# Patient Record
Sex: Female | Born: 1950
Health system: Southern US, Community
[De-identification: ages and names within clinical notes are randomized; demographics above are authoritative.]

## PROBLEM LIST (undated history)

## (undated) DIAGNOSIS — N838 Other noninflammatory disorders of ovary, fallopian tube and broad ligament: Secondary | ICD-10-CM

## (undated) DIAGNOSIS — E785 Hyperlipidemia, unspecified: Secondary | ICD-10-CM

## (undated) DIAGNOSIS — M549 Dorsalgia, unspecified: Secondary | ICD-10-CM

## (undated) DIAGNOSIS — F32A Depression, unspecified: Secondary | ICD-10-CM

## (undated) DIAGNOSIS — R4020 Unspecified coma: Secondary | ICD-10-CM

## (undated) DIAGNOSIS — M797 Fibromyalgia: Secondary | ICD-10-CM

## (undated) DIAGNOSIS — J209 Acute bronchitis, unspecified: Secondary | ICD-10-CM

## (undated) DIAGNOSIS — F329 Major depressive disorder, single episode, unspecified: Secondary | ICD-10-CM

## (undated) DIAGNOSIS — R7303 Prediabetes: Secondary | ICD-10-CM

## (undated) DIAGNOSIS — I10 Essential (primary) hypertension: Secondary | ICD-10-CM

## (undated) DIAGNOSIS — IMO0001 Reserved for inherently not codable concepts without codable children: Secondary | ICD-10-CM

## (undated) DIAGNOSIS — N3281 Overactive bladder: Secondary | ICD-10-CM

## (undated) DIAGNOSIS — J449 Chronic obstructive pulmonary disease, unspecified: Secondary | ICD-10-CM

## (undated) DIAGNOSIS — J9601 Acute respiratory failure with hypoxia: Secondary | ICD-10-CM

## (undated) DIAGNOSIS — J45909 Unspecified asthma, uncomplicated: Secondary | ICD-10-CM

## (undated) DIAGNOSIS — D751 Secondary polycythemia: Secondary | ICD-10-CM

## (undated) DIAGNOSIS — K219 Gastro-esophageal reflux disease without esophagitis: Secondary | ICD-10-CM

## (undated) HISTORY — DX: Dorsalgia, unspecified: M54.9

## (undated) HISTORY — DX: Depression, unspecified: F32.A

## (undated) HISTORY — DX: Major depressive disorder, single episode, unspecified: F32.9

## (undated) HISTORY — DX: Essential (primary) hypertension: I10

## (undated) HISTORY — PX: ABDOMINAL SURGERY: SHX537

## (undated) HISTORY — DX: Hyperlipidemia, unspecified: E78.5

## (undated) HISTORY — PX: COSMETIC SURGERY: SHX468

---

## 2007-05-20 ENCOUNTER — Emergency Department (HOSPITAL_COMMUNITY): Admission: EM | Admit: 2007-05-20 | Discharge: 2007-05-20 | Payer: Self-pay | Admitting: Emergency Medicine

## 2008-11-14 ENCOUNTER — Emergency Department (HOSPITAL_COMMUNITY): Admission: EM | Admit: 2008-11-14 | Discharge: 2008-11-14 | Payer: Self-pay | Admitting: Emergency Medicine

## 2008-11-21 ENCOUNTER — Emergency Department (HOSPITAL_COMMUNITY): Admission: EM | Admit: 2008-11-21 | Discharge: 2008-11-21 | Payer: Self-pay | Admitting: Emergency Medicine

## 2009-08-20 ENCOUNTER — Emergency Department (HOSPITAL_COMMUNITY): Admission: EM | Admit: 2009-08-20 | Discharge: 2009-08-20 | Payer: Self-pay | Admitting: Emergency Medicine

## 2009-11-24 ENCOUNTER — Ambulatory Visit: Payer: Self-pay | Admitting: Internal Medicine

## 2009-12-21 ENCOUNTER — Emergency Department (HOSPITAL_COMMUNITY): Admission: EM | Admit: 2009-12-21 | Discharge: 2009-12-21 | Payer: Self-pay | Admitting: Emergency Medicine

## 2010-01-12 ENCOUNTER — Ambulatory Visit: Payer: Self-pay | Admitting: Internal Medicine

## 2010-01-28 ENCOUNTER — Encounter (INDEPENDENT_AMBULATORY_CARE_PROVIDER_SITE_OTHER): Payer: Self-pay | Admitting: Family Medicine

## 2010-01-28 ENCOUNTER — Ambulatory Visit: Payer: Self-pay | Admitting: Internal Medicine

## 2010-01-28 LAB — CONVERTED CEMR LAB
Albumin: 4.6 g/dL (ref 3.5–5.2)
BUN: 15 mg/dL (ref 6–23)
Chloride: 102 meq/L (ref 96–112)
Creatinine, Ser: 0.63 mg/dL (ref 0.40–1.20)
HDL: 68 mg/dL (ref >39)
Hemoglobin: 12.9 g/dL (ref 12.0–15.0)
LDL Cholesterol: 193 mg/dL — ABNORMAL HIGH (ref 0–99)
Lymphs Abs: 2.8 10*3/uL (ref 0.7–4.0)
MCHC: 32.7 g/dL (ref 30.0–36.0)
MCV: 90.6 fL (ref 78.0–100.0)
Potassium: 4.4 meq/L (ref 3.5–5.3)
RBC: 4.36 M/uL (ref 3.87–5.11)
RDW: 13.4 % (ref 11.5–15.5)
TSH: 0.656 microintl units/mL (ref 0.350–4.500)
Total CHOL/HDL Ratio: 4.1
Total Protein: 7.1 g/dL (ref 6.0–8.3)
Triglycerides: 87 mg/dL (ref <150)
Vit D, 25-Hydroxy: 21 ng/mL — ABNORMAL LOW (ref 30–89)
WBC: 7.3 10*3/uL (ref 4.0–10.5)

## 2010-02-18 ENCOUNTER — Encounter: Admission: RE | Admit: 2010-02-18 | Discharge: 2010-03-18 | Payer: Self-pay | Admitting: Family Medicine

## 2011-01-19 LAB — GLUCOSE, CAPILLARY: Glucose-Capillary: 83 mg/dL (ref 70–99)

## 2011-02-03 LAB — URINALYSIS, ROUTINE W REFLEX MICROSCOPIC
Ketones, ur: NEGATIVE mg/dL
Nitrite: NEGATIVE
Protein, ur: NEGATIVE mg/dL
Specific Gravity, Urine: 1.014 (ref 1.005–1.030)
pH: 6 (ref 5.0–8.0)

## 2011-02-03 LAB — URINE MICROSCOPIC-ADD ON

## 2011-02-14 LAB — DIFFERENTIAL
Basophils Absolute: 0 10*3/uL (ref 0.0–0.1)
Basophils Relative: 1 % (ref 0–1)
Eosinophils Relative: 1 % (ref 0–5)
Lymphocytes Relative: 24 % (ref 12–46)
Lymphs Abs: 2 10*3/uL (ref 0.7–4.0)
Monocytes Absolute: 0.4 10*3/uL (ref 0.1–1.0)
Neutro Abs: 5.6 10*3/uL (ref 1.7–7.7)
Neutrophils Relative %: 69 % (ref 43–77)

## 2011-02-14 LAB — CBC
HCT: 41.5 % (ref 36.0–46.0)
RDW: 13.5 % (ref 11.5–15.5)
WBC: 8.1 10*3/uL (ref 4.0–10.5)

## 2011-02-14 LAB — BASIC METABOLIC PANEL
BUN: 12 mg/dL (ref 6–23)
Chloride: 105 mEq/L (ref 96–112)
Creatinine, Ser: 0.78 mg/dL (ref 0.4–1.2)
GFR calc Af Amer: >60 mL/min (ref >60)
Glucose, Bld: 183 mg/dL — ABNORMAL HIGH (ref 70–99)
Sodium: 139 mEq/L (ref 135–145)

## 2011-02-14 LAB — URINALYSIS, ROUTINE W REFLEX MICROSCOPIC
Hgb urine dipstick: NEGATIVE
Urobilinogen, UA: 0.2 mg/dL (ref 0.0–1.0)
pH: 5.5 (ref 5.0–8.0)

## 2011-08-15 LAB — DIFFERENTIAL
Basophils Relative: 0
Eosinophils Absolute: 0
Eosinophils Relative: 0
Monocytes Absolute: 0.3

## 2011-08-15 LAB — COMPREHENSIVE METABOLIC PANEL
AST: 26
Albumin: 4.2
Alkaline Phosphatase: 71
Calcium: 9.2
Chloride: 106
GFR calc non Af Amer: >60
Total Bilirubin: 1

## 2011-08-15 LAB — URINALYSIS, ROUTINE W REFLEX MICROSCOPIC
Glucose, UA: NEGATIVE
Ketones, ur: 15 — AB
Leukocytes, UA: NEGATIVE
Protein, ur: 30 — AB
Specific Gravity, Urine: >1.046 — ABNORMAL HIGH
pH: 5.5

## 2011-08-15 LAB — URINE MICROSCOPIC-ADD ON

## 2011-08-15 LAB — RAPID URINE DRUG SCREEN, HOSP PERFORMED
Benzodiazepines: NOT DETECTED
Cocaine: NOT DETECTED
Tetrahydrocannabinol: POSITIVE — AB

## 2011-08-15 LAB — CBC
Hemoglobin: 14.8
MCV: 92
RBC: 4.67
RDW: 13.9

## 2011-08-15 LAB — SALICYLATE LEVEL: Salicylate Lvl: <4

## 2013-04-10 ENCOUNTER — Other Ambulatory Visit (HOSPITAL_COMMUNITY): Payer: Self-pay | Admitting: Family Medicine

## 2013-04-10 DIAGNOSIS — R109 Unspecified abdominal pain: Secondary | ICD-10-CM

## 2013-04-10 DIAGNOSIS — Z1231 Encounter for screening mammogram for malignant neoplasm of breast: Secondary | ICD-10-CM

## 2013-04-16 ENCOUNTER — Ambulatory Visit (HOSPITAL_COMMUNITY)
Admission: RE | Admit: 2013-04-16 | Discharge: 2013-04-16 | Disposition: A | Discharge status: Home / Self Care | Payer: Medicaid Other | Source: Ambulatory Visit | Attending: Family Medicine | Admitting: Family Medicine

## 2013-04-16 DIAGNOSIS — R109 Unspecified abdominal pain: Secondary | ICD-10-CM | POA: Insufficient documentation

## 2013-04-16 DIAGNOSIS — K7689 Other specified diseases of liver: Secondary | ICD-10-CM | POA: Insufficient documentation

## 2013-04-17 ENCOUNTER — Ambulatory Visit (HOSPITAL_COMMUNITY)
Admission: RE | Admit: 2013-04-17 | Discharge: 2013-04-17 | Disposition: A | Discharge status: Home / Self Care | Payer: Medicaid Other | Source: Ambulatory Visit | Attending: Family Medicine | Admitting: Family Medicine

## 2013-04-17 DIAGNOSIS — Z1231 Encounter for screening mammogram for malignant neoplasm of breast: Secondary | ICD-10-CM | POA: Insufficient documentation

## 2013-08-22 ENCOUNTER — Ambulatory Visit (HOSPITAL_COMMUNITY)
Admission: RE | Admit: 2013-08-22 | Discharge: 2013-08-22 | Disposition: A | Discharge status: Home / Self Care | Payer: Medicaid Other | Source: Ambulatory Visit | Attending: Nurse Practitioner | Admitting: Nurse Practitioner

## 2013-08-22 ENCOUNTER — Other Ambulatory Visit (HOSPITAL_COMMUNITY): Payer: Self-pay | Admitting: Nurse Practitioner

## 2013-08-22 DIAGNOSIS — M545 Low back pain, unspecified: Secondary | ICD-10-CM | POA: Insufficient documentation

## 2013-08-22 DIAGNOSIS — Z9181 History of falling: Secondary | ICD-10-CM | POA: Insufficient documentation

## 2013-08-22 DIAGNOSIS — M549 Dorsalgia, unspecified: Secondary | ICD-10-CM

## 2014-05-15 ENCOUNTER — Encounter: Payer: Self-pay | Admitting: Neurology

## 2014-05-15 ENCOUNTER — Encounter: Payer: Self-pay | Admitting: *Deleted

## 2014-05-19 ENCOUNTER — Ambulatory Visit: Payer: Medicaid Other | Admitting: Neurology

## 2014-05-19 ENCOUNTER — Telehealth: Payer: Self-pay | Admitting: Neurology

## 2014-05-19 NOTE — Telephone Encounter (Signed)
This patient canceled within an hour or 2  before her appointment today for a new patient appointment.

## 2014-09-17 ENCOUNTER — Encounter: Payer: Self-pay | Admitting: Neurology

## 2014-09-23 ENCOUNTER — Encounter: Payer: Self-pay | Admitting: Neurology

## 2015-04-13 ENCOUNTER — Emergency Department (HOSPITAL_COMMUNITY): Payer: Medicaid Other

## 2015-04-13 ENCOUNTER — Inpatient Hospital Stay (HOSPITAL_COMMUNITY)
Admission: EM | Admit: 2015-04-13 | Discharge: 2015-04-15 | DRG: 190 | Disposition: A | Discharge status: Home / Self Care | Payer: Medicaid Other | Attending: Internal Medicine | Admitting: Internal Medicine

## 2015-04-13 ENCOUNTER — Encounter (HOSPITAL_COMMUNITY): Payer: Self-pay | Admitting: Emergency Medicine

## 2015-04-13 DIAGNOSIS — J209 Acute bronchitis, unspecified: Secondary | ICD-10-CM | POA: Diagnosis present

## 2015-04-13 DIAGNOSIS — E860 Dehydration: Secondary | ICD-10-CM | POA: Diagnosis present

## 2015-04-13 DIAGNOSIS — F17210 Nicotine dependence, cigarettes, uncomplicated: Secondary | ICD-10-CM | POA: Diagnosis present

## 2015-04-13 DIAGNOSIS — Z6821 Body mass index (BMI) 21.0-21.9, adult: Secondary | ICD-10-CM | POA: Diagnosis not present

## 2015-04-13 DIAGNOSIS — D751 Secondary polycythemia: Secondary | ICD-10-CM

## 2015-04-13 DIAGNOSIS — Z7952 Long term (current) use of systemic steroids: Secondary | ICD-10-CM

## 2015-04-13 DIAGNOSIS — J9601 Acute respiratory failure with hypoxia: Secondary | ICD-10-CM | POA: Diagnosis present

## 2015-04-13 DIAGNOSIS — Z79899 Other long term (current) drug therapy: Secondary | ICD-10-CM | POA: Diagnosis not present

## 2015-04-13 DIAGNOSIS — J440 Chronic obstructive pulmonary disease with acute lower respiratory infection: Secondary | ICD-10-CM | POA: Diagnosis present

## 2015-04-13 DIAGNOSIS — E119 Type 2 diabetes mellitus without complications: Secondary | ICD-10-CM | POA: Diagnosis present

## 2015-04-13 DIAGNOSIS — Z7951 Long term (current) use of inhaled steroids: Secondary | ICD-10-CM | POA: Diagnosis not present

## 2015-04-13 DIAGNOSIS — R4020 Unspecified coma: Secondary | ICD-10-CM | POA: Diagnosis present

## 2015-04-13 DIAGNOSIS — J45909 Unspecified asthma, uncomplicated: Secondary | ICD-10-CM | POA: Diagnosis present

## 2015-04-13 DIAGNOSIS — J441 Chronic obstructive pulmonary disease with (acute) exacerbation: Secondary | ICD-10-CM | POA: Diagnosis present

## 2015-04-13 DIAGNOSIS — F1110 Opioid abuse, uncomplicated: Secondary | ICD-10-CM | POA: Diagnosis present

## 2015-04-13 DIAGNOSIS — M549 Dorsalgia, unspecified: Secondary | ICD-10-CM | POA: Diagnosis present

## 2015-04-13 DIAGNOSIS — E785 Hyperlipidemia, unspecified: Secondary | ICD-10-CM | POA: Diagnosis not present

## 2015-04-13 DIAGNOSIS — M797 Fibromyalgia: Secondary | ICD-10-CM | POA: Diagnosis present

## 2015-04-13 DIAGNOSIS — F329 Major depressive disorder, single episode, unspecified: Secondary | ICD-10-CM | POA: Diagnosis present

## 2015-04-13 DIAGNOSIS — R0602 Shortness of breath: Secondary | ICD-10-CM

## 2015-04-13 DIAGNOSIS — E440 Moderate protein-calorie malnutrition: Secondary | ICD-10-CM | POA: Diagnosis present

## 2015-04-13 DIAGNOSIS — R739 Hyperglycemia, unspecified: Secondary | ICD-10-CM | POA: Diagnosis present

## 2015-04-13 DIAGNOSIS — F32A Depression, unspecified: Secondary | ICD-10-CM | POA: Diagnosis present

## 2015-04-13 DIAGNOSIS — R0902 Hypoxemia: Secondary | ICD-10-CM

## 2015-04-13 DIAGNOSIS — F418 Other specified anxiety disorders: Secondary | ICD-10-CM | POA: Diagnosis present

## 2015-04-13 DIAGNOSIS — R55 Syncope and collapse: Secondary | ICD-10-CM | POA: Diagnosis present

## 2015-04-13 DIAGNOSIS — I10 Essential (primary) hypertension: Secondary | ICD-10-CM | POA: Diagnosis present

## 2015-04-13 DIAGNOSIS — R0600 Dyspnea, unspecified: Secondary | ICD-10-CM | POA: Diagnosis not present

## 2015-04-13 HISTORY — DX: Prediabetes: R73.03

## 2015-04-13 HISTORY — DX: Secondary polycythemia: D75.1

## 2015-04-13 HISTORY — DX: Chronic obstructive pulmonary disease, unspecified: J44.9

## 2015-04-13 HISTORY — DX: Reserved for inherently not codable concepts without codable children: IMO0001

## 2015-04-13 HISTORY — DX: Unspecified asthma, uncomplicated: J45.909

## 2015-04-13 HISTORY — DX: Acute respiratory failure with hypoxia: J96.01

## 2015-04-13 HISTORY — DX: Acute bronchitis, unspecified: J20.9

## 2015-04-13 LAB — URINE MICROSCOPIC-ADD ON

## 2015-04-13 LAB — CBC WITH DIFFERENTIAL/PLATELET
Basophils Absolute: 0.2 10*3/uL — ABNORMAL HIGH (ref 0.0–0.1)
Basophils Relative: 3 % — ABNORMAL HIGH (ref 0–1)
EOS PCT: 6 % — AB (ref 0–5)
Eosinophils Absolute: 0.5 10*3/uL (ref 0.0–0.7)
HCT: 45.3 % (ref 36.0–46.0)
Hemoglobin: 15 g/dL (ref 12.0–15.0)
Lymphocytes Relative: 28 % (ref 12–46)
Lymphs Abs: 2.2 10*3/uL (ref 0.7–4.0)
MCH: 30.2 pg (ref 26.0–34.0)
MCHC: 33.1 g/dL (ref 30.0–36.0)
MCV: 91.1 fL (ref 78.0–100.0)
MONOS PCT: 6 % (ref 3–12)
Monocytes Absolute: 0.5 10*3/uL (ref 0.1–1.0)
NEUTROS ABS: 4.3 10*3/uL (ref 1.7–7.7)
Neutrophils Relative %: 57 % (ref 43–77)
Platelets: 352 10*3/uL (ref 150–400)
RBC: 4.97 MIL/uL (ref 3.87–5.11)
RDW: 13.7 % (ref 11.5–15.5)
WBC: 7.7 10*3/uL (ref 4.0–10.5)

## 2015-04-13 LAB — I-STAT ARTERIAL BLOOD GAS, ED
ACID-BASE DEFICIT: 1 mmol/L (ref 0.0–2.0)
BICARBONATE: 25 meq/L — AB (ref 20.0–24.0)
O2 Saturation: 91 %
PH ART: 7.358 (ref 7.350–7.450)
TCO2: 26 mmol/L (ref 0–100)
pCO2 arterial: 44.5 mmHg (ref 35.0–45.0)
pO2, Arterial: 64 mmHg — ABNORMAL LOW (ref 80.0–100.0)

## 2015-04-13 LAB — I-STAT CHEM 8, ED
BUN: 10 mg/dL (ref 6–20)
CHLORIDE: 102 mmol/L (ref 101–111)
Calcium, Ion: 1.11 mmol/L — ABNORMAL LOW (ref 1.13–1.30)
Creatinine, Ser: 0.7 mg/dL (ref 0.44–1.00)
GLUCOSE: 129 mg/dL — AB (ref 65–99)
HCT: 48 % — ABNORMAL HIGH (ref 36.0–46.0)
Hemoglobin: 16.3 g/dL — ABNORMAL HIGH (ref 12.0–15.0)
Potassium: 3.8 mmol/L (ref 3.5–5.1)
Sodium: 140 mmol/L (ref 135–145)
TCO2: 24 mmol/L (ref 0–100)

## 2015-04-13 LAB — PROTIME-INR
INR: 0.94 (ref 0.00–1.49)
PROTHROMBIN TIME: 12.8 s (ref 11.6–15.2)

## 2015-04-13 LAB — T4, FREE: Free T4: 1.26 ng/dL — ABNORMAL HIGH (ref 0.61–1.12)

## 2015-04-13 LAB — RAPID URINE DRUG SCREEN, HOSP PERFORMED
Amphetamines: NOT DETECTED
Barbiturates: NOT DETECTED
Benzodiazepines: NOT DETECTED
Cocaine: NOT DETECTED
OPIATES: POSITIVE — AB
TETRAHYDROCANNABINOL: NOT DETECTED

## 2015-04-13 LAB — URINALYSIS, ROUTINE W REFLEX MICROSCOPIC
Bilirubin Urine: NEGATIVE
HGB URINE DIPSTICK: NEGATIVE
Ketones, ur: 15 mg/dL — AB
Leukocytes, UA: NEGATIVE
Nitrite: NEGATIVE
PROTEIN: NEGATIVE mg/dL
Specific Gravity, Urine: 1.017 (ref 1.005–1.030)
Urobilinogen, UA: 0.2 mg/dL (ref 0.0–1.0)
pH: 5 (ref 5.0–8.0)

## 2015-04-13 LAB — I-STAT TROPONIN, ED: TROPONIN I, POC: 0 ng/mL (ref 0.00–0.08)

## 2015-04-13 LAB — D-DIMER, QUANTITATIVE (NOT AT ARMC): D-Dimer, Quant: 0.45 ug/mL-FEU (ref 0.00–0.48)

## 2015-04-13 LAB — TSH: TSH: 0.231 u[IU]/mL — AB (ref 0.350–4.500)

## 2015-04-13 LAB — BRAIN NATRIURETIC PEPTIDE: B Natriuretic Peptide: 43.9 pg/mL (ref 0.0–100.0)

## 2015-04-13 LAB — ETHANOL

## 2015-04-13 MED ORDER — IPRATROPIUM BROMIDE 0.02 % IN SOLN
1.0000 mg | Freq: Once | RESPIRATORY_TRACT | Status: AC
  Administered 2015-04-13: 1 mg via RESPIRATORY_TRACT
  Filled 2015-04-13: qty 5

## 2015-04-13 MED ORDER — METHYLPREDNISOLONE SODIUM SUCC 125 MG IJ SOLR
125.0000 mg | Freq: Once | INTRAMUSCULAR | Status: AC
  Administered 2015-04-13: 125 mg via INTRAVENOUS
  Filled 2015-04-13: qty 2

## 2015-04-13 MED ORDER — AMLODIPINE BESYLATE 5 MG PO TABS
5.0000 mg | ORAL_TABLET | Freq: Every day | ORAL | Status: DC
  Administered 2015-04-13 – 2015-04-15 (×3): 5 mg via ORAL
  Filled 2015-04-13 (×3): qty 1

## 2015-04-13 MED ORDER — HYDROCHLOROTHIAZIDE 25 MG PO TABS
25.0000 mg | ORAL_TABLET | Freq: Every day | ORAL | Status: DC
  Administered 2015-04-13 – 2015-04-14 (×2): 25 mg via ORAL
  Filled 2015-04-13 (×2): qty 1

## 2015-04-13 MED ORDER — MAGNESIUM SULFATE 2 GM/50ML IV SOLN
2.0000 g | Freq: Once | INTRAVENOUS | Status: AC
  Administered 2015-04-13: 2 g via INTRAVENOUS
  Filled 2015-04-13: qty 50

## 2015-04-13 MED ORDER — IPRATROPIUM-ALBUTEROL 0.5-2.5 (3) MG/3ML IN SOLN
3.0000 mL | RESPIRATORY_TRACT | Status: DC
  Administered 2015-04-13 – 2015-04-14 (×9): 3 mL via RESPIRATORY_TRACT
  Filled 2015-04-13 (×10): qty 3

## 2015-04-13 MED ORDER — GUAIFENESIN 100 MG/5ML PO SYRP
200.0000 mg | ORAL_SOLUTION | ORAL | Status: DC | PRN
  Administered 2015-04-13: 200 mg via ORAL
  Filled 2015-04-13 (×2): qty 10

## 2015-04-13 MED ORDER — ACETAMINOPHEN 650 MG RE SUPP: 650.0000 mg | Freq: Four times a day (QID) | RECTAL | Status: DC | PRN

## 2015-04-13 MED ORDER — DOCUSATE SODIUM 100 MG PO CAPS
100.0000 mg | ORAL_CAPSULE | Freq: Two times a day (BID) | ORAL | Status: DC
  Administered 2015-04-13 – 2015-04-15 (×5): 100 mg via ORAL
  Filled 2015-04-13 (×6): qty 1

## 2015-04-13 MED ORDER — THIAMINE HCL 100 MG/ML IJ SOLN
100.0000 mg | Freq: Every day | INTRAMUSCULAR | Status: DC
  Administered 2015-04-13: 100 mg via INTRAVENOUS
  Filled 2015-04-13 (×2): qty 1

## 2015-04-13 MED ORDER — SODIUM CHLORIDE 0.9 % IJ SOLN
3.0000 mL | Freq: Two times a day (BID) | INTRAMUSCULAR | Status: DC
  Administered 2015-04-13 – 2015-04-14 (×4): 3 mL via INTRAVENOUS

## 2015-04-13 MED ORDER — TRAZODONE HCL 50 MG PO TABS
50.0000 mg | ORAL_TABLET | Freq: Every day | ORAL | Status: DC
  Administered 2015-04-13 – 2015-04-14 (×2): 50 mg via ORAL
  Filled 2015-04-13 (×3): qty 1

## 2015-04-13 MED ORDER — IPRATROPIUM-ALBUTEROL 0.5-2.5 (3) MG/3ML IN SOLN: 3.0000 mL | RESPIRATORY_TRACT | Status: DC | PRN

## 2015-04-13 MED ORDER — DEXTROSE 5 % IV SOLN
500.0000 mg | INTRAVENOUS | Status: DC
  Administered 2015-04-14: 500 mg via INTRAVENOUS
  Filled 2015-04-13 (×2): qty 500

## 2015-04-13 MED ORDER — CYCLOBENZAPRINE HCL 5 MG PO TABS
5.0000 mg | ORAL_TABLET | Freq: Two times a day (BID) | ORAL | Status: DC
  Administered 2015-04-13 – 2015-04-15 (×5): 5 mg via ORAL
  Filled 2015-04-13 (×7): qty 1

## 2015-04-13 MED ORDER — SODIUM CHLORIDE 0.9 % IV BOLUS (SEPSIS)
1000.0000 mL | Freq: Once | INTRAVENOUS | Status: AC
Start: 2015-04-13 — End: 2015-04-13
  Administered 2015-04-13: 1000 mL via INTRAVENOUS

## 2015-04-13 MED ORDER — TIOTROPIUM BROMIDE MONOHYDRATE 18 MCG IN CAPS: 18.0000 ug | ORAL_CAPSULE | Freq: Every day | RESPIRATORY_TRACT | Status: DC

## 2015-04-13 MED ORDER — GABAPENTIN 100 MG PO CAPS
100.0000 mg | ORAL_CAPSULE | Freq: Two times a day (BID) | ORAL | Status: DC
  Administered 2015-04-13 – 2015-04-15 (×5): 100 mg via ORAL
  Filled 2015-04-13 (×7): qty 1

## 2015-04-13 MED ORDER — ALBUTEROL (5 MG/ML) CONTINUOUS INHALATION SOLN
10.0000 mg/h | INHALATION_SOLUTION | RESPIRATORY_TRACT | Status: DC
  Administered 2015-04-13: 10 mg/h via RESPIRATORY_TRACT
  Filled 2015-04-13 (×2): qty 20

## 2015-04-13 MED ORDER — ONDANSETRON HCL 4 MG PO TABS: 4.0000 mg | ORAL_TABLET | Freq: Four times a day (QID) | ORAL | Status: DC | PRN

## 2015-04-13 MED ORDER — FOLIC ACID 5 MG/ML IJ SOLN
1.0000 mg | Freq: Every day | INTRAMUSCULAR | Status: DC
  Administered 2015-04-13: 1 mg via INTRAVENOUS
  Filled 2015-04-13 (×2): qty 0.2

## 2015-04-13 MED ORDER — CITALOPRAM HYDROBROMIDE 10 MG PO TABS
10.0000 mg | ORAL_TABLET | Freq: Every day | ORAL | Status: DC
  Administered 2015-04-13 – 2015-04-15 (×3): 10 mg via ORAL
  Filled 2015-04-13 (×3): qty 1

## 2015-04-13 MED ORDER — PRAVASTATIN SODIUM 40 MG PO TABS
40.0000 mg | ORAL_TABLET | Freq: Every day | ORAL | Status: DC
  Administered 2015-04-13 – 2015-04-15 (×3): 40 mg via ORAL
  Filled 2015-04-13 (×3): qty 1

## 2015-04-13 MED ORDER — ACETAMINOPHEN 325 MG PO TABS: 650.0000 mg | ORAL_TABLET | Freq: Four times a day (QID) | ORAL | Status: DC | PRN

## 2015-04-13 MED ORDER — DEXTROSE 5 % IV SOLN
500.0000 mg | Freq: Once | INTRAVENOUS | Status: AC
  Administered 2015-04-13: 500 mg via INTRAVENOUS
  Filled 2015-04-13: qty 500

## 2015-04-13 MED ORDER — METHYLPREDNISOLONE SODIUM SUCC 125 MG IJ SOLR
60.0000 mg | Freq: Four times a day (QID) | INTRAMUSCULAR | Status: DC
  Administered 2015-04-13 – 2015-04-14 (×5): 60 mg via INTRAVENOUS
  Filled 2015-04-13: qty 2
  Filled 2015-04-13 (×2): qty 0.96
  Filled 2015-04-13: qty 2
  Filled 2015-04-13: qty 0.96
  Filled 2015-04-13: qty 2
  Filled 2015-04-13: qty 0.96
  Filled 2015-04-13: qty 2

## 2015-04-13 MED ORDER — ENOXAPARIN SODIUM 40 MG/0.4ML ~~LOC~~ SOLN
40.0000 mg | SUBCUTANEOUS | Status: DC
  Administered 2015-04-13 – 2015-04-14 (×2): 40 mg via SUBCUTANEOUS
  Filled 2015-04-13 (×3): qty 0.4

## 2015-04-13 MED ORDER — ALUM & MAG HYDROXIDE-SIMETH 200-200-20 MG/5ML PO SUSP: 30.0000 mL | Freq: Four times a day (QID) | ORAL | Status: DC | PRN

## 2015-04-13 MED ORDER — ENSURE ENLIVE PO LIQD
237.0000 mL | Freq: Two times a day (BID) | ORAL | Status: DC
  Administered 2015-04-13 – 2015-04-14 (×3): 237 mL via ORAL

## 2015-04-13 MED ORDER — GABAPENTIN 300 MG PO CAPS
300.0000 mg | ORAL_CAPSULE | Freq: Every day | ORAL | Status: DC
  Administered 2015-04-13 – 2015-04-14 (×2): 300 mg via ORAL
  Filled 2015-04-13 (×3): qty 1

## 2015-04-13 MED ORDER — ONDANSETRON HCL 4 MG/2ML IJ SOLN: 4.0000 mg | Freq: Four times a day (QID) | INTRAMUSCULAR | Status: DC | PRN

## 2015-04-13 NOTE — ED Notes (Signed)
Report given to [place], RN on [place] [phone]).

## 2015-04-13 NOTE — ED Notes (Signed)
Pt states that she has been coughing ever since she moved into new apt.

## 2015-04-13 NOTE — Progress Notes (Signed)
TSH low at 0.231 so ck Free T4 and T3; d dimer normal, UA with >1000 glucose and ketones 15- HgbA1c already pending, UDS only positive for opiates which she received in ER.  Erin Hearing, ANP

## 2015-04-13 NOTE — ED Notes (Signed)
Patient here via bystander. Patient noted to have collapsed on roadside. Passer by brought patient in for evaluation. States that for 2 days she has been feeling very short of breath and has been having increased chest discomfort. States that her medicine is not working. States she uses inhalers.

## 2015-04-13 NOTE — ED Notes (Signed)
Attempted to call report. Nurses unable to take report.

## 2015-04-13 NOTE — ED Provider Notes (Signed)
CSN: [id number]     Arrival date & time 04/13/15  6629 History   First MD Initiated Contact with Patient 04/13/15 0530     Chief Complaint  Patient presents with  . Shortness of Breath  . Chest Pain     (Consider location/radiation/quality/duration/timing/severity/associated sxs/prior Treatment) HPI Tracey Ferrell is a 64yo female, PMH of COPD presenting today with acute SOB.  History obtained from bystander who brought the patient in.  He states he [place] of [employer] around 430[place] and saw the patient breathing heavily on the floor and appears distressed.  He drove her to the ED for evaluation.  She states this feels like her COPD.  She does not wear oxygen at home.  She has been having a worsening cough.  No further history can be obtained due to resp distress.     Past Medical History  Diagnosis Date  . Back pain   . Depression   . Hyperlipidemia   . Hypertension    History reviewed. No pertinent past surgical history. History reviewed. No pertinent family history. History  Substance Use Topics  . Smoking status: Current Some Day Smoker -- 0.50 packs/day for 44 years    Types: Cigarettes  . Smokeless tobacco: Never Used  . Alcohol Use: No     Comment: former   OB History    No data available     Review of Systems  Unable to perform ROS: Severe respiratory distress      Allergies  Review of patient's allergies indicates no known allergies.  Home Medications   Prior to Admission medications   Medication Sig Start Date End Date Taking? Authorizing Provider  citalopram (CELEXA) 10 MG tablet Take 10 mg by mouth daily.    Historical Provider, MD  cyclobenzaprine (FLEXERIL) 5 MG tablet Take 5 mg by mouth 2 (two) times daily.    Historical Provider, MD  gabapentin (NEURONTIN) 100 MG capsule Take 100 mg by mouth 2 (two) times daily.    Historical Provider, MD  gabapentin (NEURONTIN) 300 MG capsule Take 300 mg by mouth at bedtime.    Historical Provider, MD   hydrochlorothiazide (HYDRODIURIL) 25 MG tablet Take 25 mg by mouth daily.    Historical Provider, MD  pravastatin (PRAVACHOL) 40 MG tablet Take 40 mg by mouth daily.    Historical Provider, MD  traZODone (DESYREL) 50 MG tablet Take 50 mg by mouth at bedtime.    Historical Provider, MD   BP 180/84 mmHg  Pulse 87  Temp(Src) 97.7 F (36.5 C) (Oral)  Resp 19  SpO2 100% Physical Exam  Constitutional: She appears well-developed and well-nourished. She appears distressed.  HENT:  Head: Normocephalic and atraumatic.  Nose: Nose normal.  Mouth/Throat: Oropharynx is clear and moist. No oropharyngeal exudate.  Eyes: Conjunctivae and EOM are normal. Pupils are equal, round, and reactive to light. No scleral icterus.  Neck: Normal range of motion. Neck supple. No JVD present. No tracheal deviation present. No thyromegaly present.  Cardiovascular: Normal rate, regular rhythm and normal heart sounds.  Exam reveals no gallop and no friction rub.   No murmur heard. Pulmonary/Chest: She is in respiratory distress. She has wheezes. She exhibits no tenderness.  Dec BS bilaterally, faint exp wheezes heard with prolonged phase, +use of accessory muscles  Abdominal: Soft. Bowel sounds are normal. She exhibits no distension and no mass. There is no tenderness. There is no rebound and no guarding.  Musculoskeletal: Normal range of motion. She exhibits no edema or tenderness.  Lymphadenopathy:    She has no cervical adenopathy.  Neurological: She is alert.  Skin: Skin is warm and dry. No rash noted. She is not diaphoretic. No erythema. No pallor.  Nursing note and vitals reviewed.   ED Course  Procedures (including critical care time) Labs Review Labs Reviewed  I-STAT CHEM 8, ED - Abnormal; Notable for the following:    Glucose, Bld 129 (*)    Calcium, Ion 1.11 (*)    Hemoglobin 16.3 (*)    HCT 48.0 (*)    All other components within normal limits  I-STAT ARTERIAL BLOOD GAS, ED - Abnormal; Notable  for the following:    pO2, Arterial 64.0 (*)    Bicarbonate 25.0 (*)    All other components within normal limits  CBC WITH DIFFERENTIAL/PLATELET  PROTIME-INR  BRAIN NATRIURETIC PEPTIDE  BLOOD GAS, ARTERIAL  I-STAT TROPOININ, ED    Imaging Review Dg Chest Port 1 View  04/13/2015   CLINICAL DATA:  Shortness of breath.  History of hypertension.  EXAM: PORTABLE CHEST - 1 VIEW  COMPARISON:  Chest radiograph November 21, 2008  FINDINGS: Cardiomediastinal silhouette is unremarkable. The lungs are clear without pleural effusions or focal consolidations. Similar increased lung volumes can be seen with COPD. Trachea projects midline and there is no pneumothorax. Soft tissue planes and included osseous structures are non-suspicious.  IMPRESSION: No acute cardiopulmonary process; stable appearance of the chest from November 21, 2008   Electronically Signed   By: Elon Alas M.D.   On: 04/13/2015 06:04     EKG Interpretation   Date/Time:  Monday April 13 2015 05:19:43 EDT Ventricular Rate:  98 PR Interval:  194 QRS Duration: 70 QT Interval:  356 QTC Calculation: 454 R Axis:   93 Text Interpretation:  Artifact Confirmed by Glynn Octave [phone])  on 04/13/2015 6:25:31 AM      MDM   Final diagnoses:  SOB (shortness of breath)    Patient presents to the ED for severe SOB.  Has h/o COPD but reportedly also takes lasix.  Bedside US does not reveal any B lines.  Will treat as COPD with albuterol, ipratropium, steroids, magnesium and azithromycin.  Patient given 1L IVF bolus as well.  She was initially hypoxic to 85%on RA.  She will require admission for COPD exacerbation and severe respiratory distress.  I spoke with Dr. Posey Pronto with [facility] who agrees with admission.  Upon repeat evaluation of the patient, she is breathing much more comfortably on bipap, however she is still working and using accessory muscles.  She remains critical.  CRITICAL CARE Performed by: Everlene Balls   Total  critical care time: 64min respiratory distress  Critical care time was exclusive of separately billable procedures and treating other patients.  Critical care was necessary to treat or prevent imminent or life-threatening deterioration.  Critical care was time spent personally by me on the following activities: development of treatment plan with patient and/or surrogate as well as nursing, discussions with consultants, evaluation of patient's response to treatment, examination of patient, obtaining history from patient or surrogate, ordering and performing treatments and interventions, ordering and review of laboratory studies, ordering and review of radiographic studies, pulse oximetry and re-evaluation of patient's condition.    Emergency Ultrasound: Limited Thoracic Performed and interpreted by Dr Claudine Mouton Longitudinal view of anterior left and right lung fields in real-time with linear probe. Indication: SOB Findings: positive lung sliding negative B lines Interpretation: no evidence of pneumothorax. Images electronically archived.  Everlene Balls, MD 04/13/15 1520

## 2015-04-13 NOTE — H&P (Addendum)
Patient was seen, examined, treatment plan was discussed with the Physician extender. I have directly reviewed the clinical findings, lab, imaging studies and management of this patient in detail. I have made the necessary changes to the above noted documentation, and agree with the documentation, as recorded by the Physician extender.  64 year old female with past medical history of dyslipidemia, hypertension, fibromyalgia, depression who was brought to [facility] after she was found laying on the floor on the roadside by bystanders. Patient did not fall and she did not lose consciousness. She reports that she felt extremely short of breath for which reason she needed to lay down. She says that she has been feeling more short of breath at rest as well as with exertion over past couple of days and that inhaler is used at home did not provide significant symptomatic relief. No reports of fevers or cough. No reports of chest pain or palpitations. No abdominal pain, nausea or vomiting. No lightheadedness or dizziness. No urinary complaints. No reports of blood in the stool or urine.  In ED blood pressure was 160/93, HR 77-109, RR 19-32, afebrile and oxygen saturation 88% on room air. It has improved to 95% with 2 L nasal cannula oxygen support. Blood work was essentially unremarkable. Chest x-ray did not reveal acute cardiopulmonary findings. Troponin was within normal limits. BNP was within normal limits. She was given nebulizer treatments and Solu-Medrol in ED but continued to feel short of breath. She was admitted for further evaluation of respiratory failure with hypoxia, COPD exacerbation.  Assessment and plan:  Principal Problem:   Acute respiratory failure with hypoxia / acute COPD exacerbation - Hypoxia likely secondary to acute COPD exacerbation, possible acute bronchitis however my concern is that there may be a possible pulmonary embolism considering tachycardia, tachypnea and hypoxia. -  Chest x-ray was similar to prior studies with no acute cardiopulmonary process identified. - I will obtain d-dimer and if positive will proceed with CT angiogram of the chest to rule out pulmonary embolism. - Order placed for DuoNeb every 4 hours scheduled and every 4 hours as needed for shortness of breath or wheezing - Continue Solu-Medrol 60 mg IV every 6 hours scheduled - Continue oxygen support via nasal cannula to keep oxygen saturation above 90% - Continue azithromycin for treatment of possible acute bronchitis - Of note, BNP was within normal limits and the troponin level was within normal limits. - Will also check UDS and alcohol level. On previous UDS in past she had THC detected in urine.  Active Problems:   Depression - Stable. Continue citalopram 10 mg daily    Fibromyalgia - Stable. Continue Flexeril, gabapentin    Dyslipidemia - Resume Pravachol   HTN (hypertension) - Will resume HCTZ 25 mg daily.  - We will start Norvasc 5 mg daily for better blood pressure control since her blood pressure on the admission was in 160s range and as high as in 180's range.    Tracey Ferrell [facility] [phone]  *For further details please refer to admission note done by physician extender below.    [facility] History and Physical  Thersea Ferrell, is a 64 y.o. female  MRN: [id number]   DOB - 12/25/1950  Admit Date - 04/13/2015  Outpatient Primary MD for the patient is Tracey Simmer, MD  Referring ER MD: Dr. Everlene Balls  With History of -  Past Medical History  Diagnosis Date  . Back pain   . Depression   . Hyperlipidemia   . Hypertension       History reviewed. No pertinent past surgical history.  in for   Chief Complaint  Patient presents with  . Shortness of Breath  . Chest Pain     HPI This is a 64 year old female patient with a history of former tobacco abuse and resultant  COPD, fibromyalgia, depression with anxiety. Patient was sent to the ER via EMS after being found down but conscious outside a [employer] store. According to the patient who lives alone, she decided to walk to the [employer] so she could talk with one of her friends who works there stating she was very lonely. She denies prior issues with significant dyspnea on exertion or trouble breathing while walking long distances. By the time she got to the store she was extremely short of breath and had to sit down on the sidewalk and eventually had to lay down. A passerby noticed her, this was about 4:30 in the morning, and called EMS. She was found to be in acute respiratory distress with poor air movement so she was transported to the ER for further evaluation.  Upon arrival to the ER the patient was tachypneic with a respiratory rate of 32, her room air saturations were 88%, she was hypertensive with blood pressure of 160/93 and she was afebrile. She was given a continuous nebulizer treatment. An ABG was obtained which revealed PO2 of 64 with a normal PCO2. She improved with those treatments as well as Solu-Medrol and oxygen. Chest x-ray was negative for acute infiltrate. BNP was 44, troponin was 0.00. EKG had a lot of baseline artifact but no apparent acute ischemic changes. Laboratory data was otherwise unremarkable except for mild hyperglycemia with a glucose of 129 and hemoconcentration versus chronic polycythemia from COPD with a hemoglobin of between 15 and 16.3. In further discussion with the patient she reports that she has been recently been treated as an outpatient for COPD exacerbation stating she has had a persistent cough with productive clear to white sputum but no fevers no chills. She was given a prescription cough medicine by her primary care physician which made her itch but she does not have the name of this medicine. She reports that her COPD had been relatively stable until about 4 months ago when  she moved into a new apartment and became sicker. She reports increasing respiratory symptoms after moving to that apartment. This was one of the motivating factors in her walking to the [employer] last night according to the patient.     Review of Systems   In addition to the HPI above,  No Fever-chills, myalgias or other constitutional symptoms No Headache, changes with Vision or hearing, new weakness, tingling, numbness in any extremity, No problems swallowing food or Liquids, indigestion/reflux No Chest pain, palpitations, orthopnea  No Abdominal pain, N/V; no melena or hematochezia, no dark tarry stools, Bowel movements are regular, No dysuria, hematuria or flank pain No new skin rashes, lesions, masses or bruises, No new joints pains-aches No recent weight gain or loss No polyuria, polydypsia or polyphagia,  *A full 10 point Review of Systems was  done, except as stated above, all other Review of Systems were negative.  Social History History  Substance Use Topics  . Smoking status: Current Some Day Smoker -- 0.50 packs/day for 44 years    Types: Cigarettes  . Smokeless tobacco: Never Used  . Alcohol Use: No     Comment: former    Resides at: Private residence  Lives with: Alone  Ambulatory status: w/o assistive devices   Family History History reviewed. No pertinent family history relative to current admission   Prior to Admission medications   Medication Sig Start Date End Date Taking? Authorizing Provider  albuterol (PROVENTIL HFA;VENTOLIN HFA) 108 (90 BASE) MCG/ACT inhaler Inhale 2 puffs into the lungs every 6 (six) hours as needed for wheezing or shortness of breath.   Yes Historical Provider, MD  guaiFENesin-codeine (ROBITUSSIN AC) 100-10 MG/5ML syrup Take 10 mLs by mouth every 4 (four) hours as needed for cough.   Yes Historical Provider, MD  ibuprofen (ADVIL,MOTRIN) 200 MG tablet Take 600 mg by mouth every 6 (six) hours as needed for moderate pain.   Yes  Historical Provider, MD  tiotropium (SPIRIVA) 18 MCG inhalation capsule Place 18 mcg into inhaler and inhale daily.   Yes Historical Provider, MD  citalopram (CELEXA) 10 MG tablet Take 10 mg by mouth daily.    Historical Provider, MD  cyclobenzaprine (FLEXERIL) 5 MG tablet Take 5 mg by mouth 2 (two) times daily.    Historical Provider, MD  gabapentin (NEURONTIN) 100 MG capsule Take 100 mg by mouth 2 (two) times daily.    Historical Provider, MD  gabapentin (NEURONTIN) 300 MG capsule Take 300 mg by mouth at bedtime.    Historical Provider, MD  hydrochlorothiazide (HYDRODIURIL) 25 MG tablet Take 25 mg by mouth daily.    Historical Provider, MD  pravastatin (PRAVACHOL) 40 MG tablet Take 40 mg by mouth daily.    Historical Provider, MD  traZODone (DESYREL) 50 MG tablet Take 50 mg by mouth at bedtime.    Historical Provider, MD    No Known Allergies  Physical Exam  Vitals  Blood pressure 122/66, pulse 108, temperature 97.7 F (36.5 C), temperature source Oral, resp. rate 16, SpO2 98 %.   General:  In no acute distress, appears healthy and well nourished  Psych:  Normal affect, Denies Suicidal or Homicidal ideations, Awake Alert, Oriented X 3. Speech and thought patterns are clear and appropriate, no apparent short term memory deficits  Neuro:   No focal neurological deficits, CN II through XII intact, Strength 5/5 all 4 extremities, Sensation intact all 4 extremities.  ENT:  Ears and Eyes appear Normal, Conjunctivae clear, PER. Moist oral mucosa without erythema or exudates.  Neck:  Supple, No lymphadenopathy appreciated  Respiratory:  Symmetrical chest wall movement, Good air movement bilaterally, scattered wheezes with expiratory rhonchi right midfield, 2 L  Cardiac:  RRR, No Murmurs, no LE edema noted, no JVD, No carotid bruits, peripheral pulses palpable at 2+  Abdomen:  Positive bowel sounds, Soft, Non tender, Non distended,  No masses appreciated, no obvious  hepatosplenomegaly  Skin:  No Cyanosis, Normal Skin Turgor, No Skin Rash or Bruise.  Extremities: Symmetrical without obvious trauma or injury,  no effusions.  Data Review  CBC  Recent Labs Lab 04/13/15 0549 04/13/15 0555  WBC 7.7  --   HGB 15.0 16.3*  HCT 45.3 48.0*  PLT 352  --   MCV 91.1  --   MCH 30.2  --   MCHC 33.1  --  RDW 13.7  --   LYMPHSABS 2.2  --   MONOABS 0.5  --   EOSABS 0.5  --   BASOSABS 0.2*  --     Chemistries   Recent Labs Lab 04/13/15 0555  NA 140  K 3.8  CL 102  GLUCOSE 129*  BUN 10  CREATININE 0.70    CrCl cannot be calculated (Unknown ideal weight.).  No results for input(s): TSH, T4TOTAL, T3FREE, THYROIDAB in the last 72 hours.  Invalid input(s): FREET3  Coagulation profile  Recent Labs Lab 04/13/15 0549  INR 0.94    No results for input(s): DDIMER in the last 72 hours.  Cardiac Enzymes No results for input(s): CKMB, TROPONINI, MYOGLOBIN in the last 168 hours.  Invalid input(s): CK  Invalid input(s): POCBNP  Urinalysis    Component Value Date/Time   COLORURINE YELLOW 08/20/2009 1521   APPEARANCEUR CLOUDY* 08/20/2009 1521   LABSPEC 1.014 08/20/2009 1521   PHURINE 6.0 08/20/2009 1521   GLUCOSEU NEGATIVE 08/20/2009 1521   HGBUR NEGATIVE 08/20/2009 1521   BILIRUBINUR NEGATIVE 08/20/2009 1521   [place] 08/20/2009 1521   PROTEINUR NEGATIVE 08/20/2009 1521   UROBILINOGEN 0.2 08/20/2009 1521   NITRITE NEGATIVE 08/20/2009 1521   LEUKOCYTESUR TRACE* 08/20/2009 1521    Imaging results:   Dg Chest Port 1 View  04/13/2015   CLINICAL DATA:  Shortness of breath.  History of hypertension.  EXAM: PORTABLE CHEST - 1 VIEW  COMPARISON:  Chest radiograph November 21, 2008  FINDINGS: Cardiomediastinal silhouette is unremarkable. The lungs are clear without pleural effusions or focal consolidations. Similar increased lung volumes can be seen with COPD. Trachea projects midline and there is no pneumothorax. Soft tissue  planes and included osseous structures are non-suspicious.  IMPRESSION: No acute cardiopulmonary process; stable appearance of the chest from November 21, 2008   Electronically Signed   By: Elon Alas M.D.   On: 04/13/2015 06:04     EKG: (Independently reviewed) sinus tachycardia with significant artifact therefore difficult to make definitive interpretation-repeat EKG pending   Assessment & Plan  Principal Problem:   Acute respiratory failure with hypoxia:   A) COPD exacerbation   B) Bronchitis, acute -Admit to telemetry -Empiric Zithromax to cover for atypical organisms and COPD bronchitis -Solu-Medrol IV 60 mg every 6 hours with eventual transition to prednisone taper -Supportive care with oxygen, nebulizers and antitussives -Flutter valve -Patient somewhat of a difficult historian so we'll repeat EKG to ensure no definitive cardiac etiology although no typical symptoms reported -Seems to have had progression and COPD and may have cor pulmonale/pulmonary hypertension so we'll check echocardiogram -Patient reports progressive COPD symptoms since moving into a new apartment-she reports all of the heating and air system filters have been changed with no improvement in her symptoms; she had obtained a dog as a pet when she moved them but the dog is gone and she still has similar symptoms. 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    Depression -Continue preadmission medications -Check TSH    Fibromyalgia -Continue preadmission medications    DVT Prophylaxis: Lovenox  Family Communication:   No family at bedside-face sheet lists Pearson Forster  friend as  emergency contact  Code Status:  Full code  Condition:  Stable  Discharge disposition: Anticipate return to home once COPD exacerbation has resolved  Time spent in minutes : 60      ELLIS,ALLISON L. ANP on 04/13/2015 at 7:54 AM  Between 7am to 7pm - Pager - [phone]  After 7pm go to [email or web address] - password [email or web address]  And look for the night coverage person covering me after hours  [facility]

## 2015-04-14 ENCOUNTER — Inpatient Hospital Stay (HOSPITAL_COMMUNITY): Payer: Medicaid Other

## 2015-04-14 DIAGNOSIS — I10 Essential (primary) hypertension: Secondary | ICD-10-CM

## 2015-04-14 DIAGNOSIS — R4020 Unspecified coma: Secondary | ICD-10-CM

## 2015-04-14 DIAGNOSIS — R404 Transient alteration of awareness: Secondary | ICD-10-CM

## 2015-04-14 DIAGNOSIS — R0600 Dyspnea, unspecified: Secondary | ICD-10-CM

## 2015-04-14 HISTORY — DX: Unspecified coma: R40.20

## 2015-04-14 LAB — CBC
HCT: 40.7 % (ref 36.0–46.0)
HEMOGLOBIN: 13.3 g/dL (ref 12.0–15.0)
MCH: 29.7 pg (ref 26.0–34.0)
MCHC: 32.7 g/dL (ref 30.0–36.0)
MCV: 90.8 fL (ref 78.0–100.0)
Platelets: 346 10*3/uL (ref 150–400)
RBC: 4.48 MIL/uL (ref 3.87–5.11)
RDW: 13.9 % (ref 11.5–15.5)
WBC: 19.5 10*3/uL — ABNORMAL HIGH (ref 4.0–10.5)

## 2015-04-14 LAB — COMPREHENSIVE METABOLIC PANEL
ALBUMIN: 3.7 g/dL (ref 3.5–5.0)
ALT: 17 U/L (ref 14–54)
ANION GAP: 9 (ref 5–15)
AST: 18 U/L (ref 15–41)
Alkaline Phosphatase: 104 U/L (ref 38–126)
BUN: 16 mg/dL (ref 6–20)
CHLORIDE: 98 mmol/L — AB (ref 101–111)
CO2: 32 mmol/L (ref 22–32)
Calcium: 9.5 mg/dL (ref 8.9–10.3)
Creatinine, Ser: 0.7 mg/dL (ref 0.44–1.00)
Glucose, Bld: 165 mg/dL — ABNORMAL HIGH (ref 65–99)
POTASSIUM: 3.6 mmol/L (ref 3.5–5.1)
SODIUM: 139 mmol/L (ref 135–145)
Total Bilirubin: 0.6 mg/dL (ref 0.3–1.2)
Total Protein: 7.1 g/dL (ref 6.5–8.1)

## 2015-04-14 LAB — HEMOGLOBIN A1C
HEMOGLOBIN A1C: 6.8 % — AB (ref 4.8–5.6)
Mean Plasma Glucose: 148 mg/dL

## 2015-04-14 MED ORDER — SODIUM CHLORIDE 0.9 % IV SOLN
INTRAVENOUS | Status: DC
  Administered 2015-04-14 (×2): via INTRAVENOUS

## 2015-04-14 MED ORDER — ENSURE ENLIVE PO LIQD
237.0000 mL | ORAL | Status: DC
  Administered 2015-04-15: 237 mL via ORAL

## 2015-04-14 MED ORDER — FOLIC ACID 1 MG PO TABS
1.0000 mg | ORAL_TABLET | Freq: Every day | ORAL | Status: DC
  Administered 2015-04-14 – 2015-04-15 (×2): 1 mg via ORAL
  Filled 2015-04-14 (×2): qty 1

## 2015-04-14 MED ORDER — ENOXAPARIN SODIUM 30 MG/0.3ML ~~LOC~~ SOLN
30.0000 mg | SUBCUTANEOUS | Status: DC
  Administered 2015-04-15: 30 mg via SUBCUTANEOUS
  Filled 2015-04-14 (×2): qty 0.3

## 2015-04-14 MED ORDER — METHYLPREDNISOLONE SODIUM SUCC 125 MG IJ SOLR
60.0000 mg | Freq: Two times a day (BID) | INTRAMUSCULAR | Status: DC
  Administered 2015-04-14: 60 mg via INTRAVENOUS
  Filled 2015-04-14 (×2): qty 0.96

## 2015-04-14 MED ORDER — VITAMIN B-1 100 MG PO TABS
100.0000 mg | ORAL_TABLET | Freq: Every day | ORAL | Status: DC
  Administered 2015-04-14 – 2015-04-15 (×2): 100 mg via ORAL
  Filled 2015-04-14 (×2): qty 1

## 2015-04-14 NOTE — Evaluation (Signed)
Occupational Therapy Evaluation Patient Details Name: Tracey Ferrell MRN: [id number] DOB: 19-Jan-1951 Today's Date: 04/14/2015    History of Present Illness 64 year old female with past medical history of dyslipidemia, hypertension, fibromyalgia, depression who was brought to [facility] after she was found laying on the floor on the roadside by bystanders. Patient did not fall and she did not lose consciousness. She reports that she felt extremely short of breath for which reason she needed to lay down   Clinical Impression   Pt was independent prior to admission, but admits to struggling to perform IADL for the last several months due to increasing SOB and weakness.  Pt presents with impaired activity tolerance, with 02 sats at 92% on RA. She demonstrates decreased balance and safety awareness.  Pt somewhat resistant to the idea of using a RW.  Educated pt in benefits of a rollator for energy conservation and safety.  Will follow acutely.    Follow Up Recommendations  Home health OT    Equipment Recommendations   (4 wheel walker)    Recommendations for Other Services       Precautions / Restrictions Precautions Precautions: Fall      Mobility Bed Mobility Overal bed mobility: Modified Independent                Transfers Overall transfer level: Needs assistance Equipment used: Rolling walker (2 wheeled) Transfers: Sit to/from Stand Sit to Stand: Supervision         General transfer comment: stands abruptly without concern for IV line or safety    Balance Overall balance assessment: Needs assistance   Sitting balance-Leahy Scale: Good       Standing balance-Leahy Scale: Fair                              ADL Overall ADL's : Needs assistance/impaired Eating/Feeding: Independent;Sitting   Grooming: Wash/dry hands;Oral care;Standing;Supervision/safety Grooming Details (indicate cue type and reason): assisted to open toothbrush  package Upper Body Bathing: Set up;Sitting   Lower Body Bathing: Supervison/ safety;Sit to/from stand   Upper Body Dressing : Set up;Sitting   Lower Body Dressing: Supervision/safety;Sit to/from stand   Toilet Transfer: Min guard;Ambulation;Comfort height toilet;RW   Toileting- Clothing Manipulation and Hygiene: Supervision/safety;Sit to/from stand       Functional mobility during ADLs: Min guard;Rolling walker General ADL Comments: Instructed pt in benefits of a rollator for energy conservation and fall prevention     Vision     Perception     Praxis      Pertinent Vitals/Pain Pain Assessment: No/denies pain     Hand Dominance Right   Extremity/Trunk Assessment Upper Extremity Assessment Upper Extremity Assessment: Generalized weakness (unable to open packaging )   Lower Extremity Assessment Lower Extremity Assessment: Defer to PT evaluation   Cervical / Trunk Assessment Cervical / Trunk Assessment: Normal   Communication Communication Communication: No difficulties   Cognition Arousal/Alertness: Awake/alert Behavior During Therapy: WFL for tasks assessed/performed Overall Cognitive Status: Impaired/Different from baseline Area of Impairment: Orientation;Safety/judgement Orientation Level: Disoriented to;Time       Safety/Judgement: Decreased awareness of safety;Decreased awareness of deficits         General Comments       Exercises       Shoulder Instructions      Home Living Family/patient expects to be discharged to:: Private residence Living Arrangements: Alone Available Help at Discharge: Friend(s);Available PRN/intermittently Type of Home: Apartment Home Access: Stairs  to enter Entrance Stairs-Number of Steps: 10   Home Layout: One level     Bathroom Shower/Tub: Teacher, early years/pre: Standard     Home Equipment: None          Prior Functioning/Environment Level of Independence: Independent         Comments: pt does not drive and walks to [employer] for groceries    OT Diagnosis: Generalized weakness;Cognitive deficits   OT Problem List: Decreased strength;Decreased activity tolerance;Impaired balance (sitting and/or standing);Decreased safety awareness;Decreased knowledge of use of DME or AE   OT Treatment/Interventions: Self-care/ADL training;Patient/family education;DME and/or AE instruction;Energy conservation;Therapeutic activities    OT Goals(Current goals can be found in the care plan section) Acute Rehab OT Goals Patient Stated Goal: return home OT Goal Formulation: With patient Time For Goal Achievement: 04/21/15 Potential to Achieve Goals: Good ADL Goals Pt Will Perform Grooming: with modified independence;standing Pt Will Transfer to Toilet: with modified independence;regular height toilet;ambulating Pt Will Perform Tub/Shower Transfer: Tub transfer;with modified independence;ambulating Additional ADL Goal #1: Pt will be knowledgeable in energy conservation strategies and breathing techniques.  OT Frequency: Min 2X/week   Barriers to D/C:            Co-evaluation              End of Session Equipment Utilized During Treatment: Rolling walker  Activity Tolerance: Patient tolerated treatment well (02 sats at 92 after standing grooming ) Patient left: in chair;with call bell/phone within reach;with chair alarm set   Time: 6962-9528 OT Time Calculation (min): 16 min Charges:  OT General Charges $OT Visit: 1 Procedure OT Evaluation $Initial OT Evaluation Tier I: 1 Procedure G-Codes:    Malka So 04/14/2015, 11:39 AM  [phone]

## 2015-04-14 NOTE — Progress Notes (Signed)
[facility] PROGRESS NOTE  Tracey Ferrell QQV:[id number] DOB: [birth year] DOA: 04/13/2015 PCP: Wendie Simmer, MD    HPI/Subjective: Tracey Ferrell, a 64 year old female with a history of COPD, asthma, fibromyalgia, HTN, HLD, and depression presented with acute SOB. She was brought in by a bystander who saw the patient breathing heavily on the floor and appearing distressed outside of [employer] at 4:30am and brought her to the ED. She previously stated that she did not fall and did not lose consciousness. Today, while retelling the story, she stated that she had lost consciousness and had pain. She has been having a worsening cough recently. She has a productive cough with clear/white foam. She does not wear oxygen at home.  Assessment/Plan:  Principal Problem:   Acute respiratory failure with hypoxia Active Problems:   COPD exacerbation   Depression   Fibromyalgia   Polycythemia   Bronchitis, acute   Dyslipidemia   HTN (hypertension)   LOC (loss of consciousness)   Principal Problem: Acute respiratory failure with hypoxia/acute COPD exacerbation Patient presented with hypoxia, likely due to COPD exacerbation or possible acute bronchitis.  PE was ruled out with a D-dimer of 0.45. CXR showed NO acute cardiopulmonary process and appearance stable with CXR from 2010. Continue oxygen via nasal cannula, Solu-Medrol PRN, DuoNed PRN, and azithromycin for possible acute bronchitis. UDS is positive for opiates and blood alcohol level of <5.  Active Problems: Depression Continue citalopram  Fibromyalgia Continue flexeril and gabapentin  Dyslipidemia Continue pravastatin  HTN Continue HCTZ and amlodipine Most recent BP reading of 111/57 on 04/14/15 at 0700.  Loss of consciousness Patient is very poor historian, mentioned yesterday to the admitting physician that she did not lose consciousness. Mentioned to me today that she lost her consciousness and when she was laying on the  floor. Unclear if this is syncopal episode or patient fell because of extreme shortness of breath/hypoxia. Patient appears to be okay, d-dimer is negative, UDS/alcohol level negative. No lateralization symptoms, we'll monitor closely, I will not do any further workup. Continue telemetry.   Code Status: Full code Family Communication: No family at bedside. Patient is understanding of plan of care. Disposition Plan: within 48 hours   Consultants:  None  Procedures:  None  Antibiotics:  Azithromycin 500mg  in dextrose 5% 267mL IVPB every 24 hours, starting 04/13/13.   Objective: Filed Vitals:   04/14/15 0824  BP: 111/57  Pulse: 96  Temp:   Resp:     Intake/Output Summary (Last 24 hours) at 04/14/15 1108 Last data filed at 04/14/15 1031  Gross per 24 hour  Intake   1959 ml  Output   2375 ml  Net   -416 ml   Filed Weights   04/13/15 0824 04/13/15 0919 04/14/15 0554  Weight: 46.267 kg (102 lb) 45.541 kg (100 lb 6.4 oz) 46.04 kg (101 lb 8 oz)    Exam:   General:  WDWN in no acute distress  Cardiovascular: RRR, no m/r/g. No LEE noted.  Respiratory: No wheezes or crackles. Upper airway turbulence.  Abdomen: nt, nd, with no masses  Musculoskeletal: no obvious injuries or trauma. Good strength.  Data Reviewed: Basic Metabolic Panel:  Recent Labs Lab 04/13/15 0555 04/14/15 0429  NA 140 139  K 3.8 3.6  CL 102 98*  CO2  --  32  GLUCOSE 129* 165*  BUN 10 16  CREATININE 0.70 0.70  CALCIUM  --  9.5   Liver Function Tests:  Recent Labs Lab 04/14/15 0429  AST  18  ALT 17  ALKPHOS 104  BILITOT 0.6  PROT 7.1  ALBUMIN 3.7   CBC:  Recent Labs Lab 04/13/15 0549 04/13/15 0555 04/14/15 0429  WBC 7.7  --  19.5*  NEUTROABS 4.3  --   --   HGB 15.0 16.3* 13.3  HCT 45.3 48.0* 40.7  MCV 91.1  --  90.8  PLT 352  --  346    Studies: Dg Chest Port 1 View  04/13/2015   CLINICAL DATA:  Shortness of breath.  History of hypertension.  EXAM: PORTABLE CHEST  - 1 VIEW  COMPARISON:  Chest radiograph November 21, 2008  FINDINGS: Cardiomediastinal silhouette is unremarkable. The lungs are clear without pleural effusions or focal consolidations. Similar increased lung volumes can be seen with COPD. Trachea projects midline and there is no pneumothorax. Soft tissue planes and included osseous structures are non-suspicious.  IMPRESSION: No acute cardiopulmonary process; stable appearance of the chest from November 21, 2008   Electronically Signed   By: Tracey Ferrell M.D.   On: 04/13/2015 06:04    Scheduled Meds: . amLODipine  5 mg Oral Daily  . azithromycin  500 mg Intravenous Q24H  . citalopram  10 mg Oral Daily  . cyclobenzaprine  5 mg Oral BID  . docusate sodium  100 mg Oral BID  . [START ON 04/15/2015] enoxaparin (LOVENOX) injection  30 mg Subcutaneous Q24H  . feeding supplement (ENSURE ENLIVE)  237 mL Oral BID BM  . folic acid  1 mg Oral Daily  . gabapentin  100 mg Oral BID WC  . gabapentin  300 mg Oral QHS  . hydrochlorothiazide  25 mg Oral Daily  . ipratropium-albuterol  3 mL Nebulization Q4H  . methylPREDNISolone (SOLU-MEDROL) injection  60 mg Intravenous Q6H  . pravastatin  40 mg Oral Daily  . sodium chloride  3 mL Intravenous Q12H  . thiamine  100 mg Oral Daily  . traZODone  50 mg Oral QHS   Continuous Infusions:   Principal Problem:   Acute respiratory failure with hypoxia Active Problems:   COPD exacerbation   Depression   Fibromyalgia   Polycythemia   Bronchitis, acute   Dyslipidemia   HTN (hypertension)    Time spent: [place], [place] PA-S  [facility] Pager [phone]. If 7PM-7AM, please contact night-coverage at [email or web address], password [facility] 04/14/2015, 11:08 AM  LOS: 1 day      Addendum  Patient seen and examined, chart and data base reviewed.  I agree with the above assessment and plan.  For full details please see [name] PA-S note.  I reviewed and amended the above note as  appropriate.   Birdie Hopes, MD [facility] Pager: [phone] 04/14/2015, 11:50 AM

## 2015-04-14 NOTE — Progress Notes (Signed)
Echocardiogram 2D Echocardiogram has been performed.  Joelene Millin 04/14/2015, 2:33 PM

## 2015-04-14 NOTE — Progress Notes (Signed)
Initial Nutrition Assessment  DOCUMENTATION CODES:  Non-severe (moderate) malnutrition in context of acute illness/injury  INTERVENTION:  Ensure Enlive (each supplement provides 350kcal and 20 grams of protein)  NUTRITION DIAGNOSIS:  Inadequate oral intake related to acute illness as evidenced by percent weight loss.   GOAL:  Patient will meet greater than or equal to 90% of their needs   MONITOR:  PO intake, Labs, Weight trends, Skin, I & O's  REASON FOR ASSESSMENT:  Malnutrition Screening Tool    ASSESSMENT: 64 year old female with a history of COPD, asthma, fibromyalgia, HTN, HLD, and depression presented with acute SOB. She was brought in by a bystander who saw the patient breathing heavily on the floor and appearing distressed outside of [employer] at 4:30am and brought her to the ED.  Pt states that she usually has a good appetite and eats well but, for the past few days she has been eating very little due to not feeling well. She reports that she usually weighs 105-110 lbs. Based on pt report she has lost 3% of her body weight in less than one week. She reports ongoing poor appetite this morning, only ate a slice of toast; drank Ensure Enlive after breakfast and likes it. RD encouraged intake of protein-rich foods at each meal.  Hemoglobin A1C is elevated; pt states she has been told by her doctor that she is borderline diabetic. RD encouraged pt to avoid sugary beverages and desserts. Encouraged intake of vegetables, lean protein, and whole grains. Pt denies any further questions or education needs at this time.   Labs reviewed  Height:  Ht Readings from Last 1 Encounters:  04/13/15 4\' 9"  (1.448 m)    Weight:  Wt Readings from Last 1 Encounters:  04/14/15 101 lb 8 oz (46.04 kg)    Ideal Body Weight:  43.2 kg  Wt Readings from Last 10 Encounters:  04/14/15 101 lb 8 oz (46.04 kg)    BMI:  Body mass index is 21.96 kg/(m^2).  Estimated Nutritional  Needs:  Kcal:  1200-1400  Protein:  70-80 grams  Fluid:  1.2-1.4 L/day  Skin:  Reviewed, no issues  Diet Order:  Diet regular Room service appropriate?: Yes; Fluid consistency:: Thin  EDUCATION NEEDS:  No education needs identified at this time   Intake/Output Summary (Last 24 hours) at 04/14/15 1401 Last data filed at 04/14/15 1031  Gross per 24 hour  Intake   1482 ml  Output   2375 ml  Net   -893 ml    Last BM:  6/12  Pryor Ochoa RD, LDN Inpatient Clinical Dietitian Pager: [phone] After Hours Pager: [phone]

## 2015-04-14 NOTE — Evaluation (Signed)
Physical Therapy Evaluation Patient Details Name: Tracey Ferrell MRN: [id number] DOB: 11-13-1950 Today's Date: 04/14/2015   History of Present Illness  64 year old female with past medical history of dyslipidemia, hypertension, fibromyalgia, depression who was brought to [facility] after she was found laying on the floor on the roadside by bystanders. Patient did not fall and she did not lose consciousness. She reports that she felt extremely short of breath for which reason she needed to lay down  Clinical Impression  Pt very pleasant, not oriented to time with decreased balance and safety awareness. Pt reports she was having difficulty breathing for the last 4 months with sats 89-92% on RA with all activity today. Pt educated for need for DME for balance with gait and encouraged use at all times as well as benefit of rollator for items with daily activities. Pt educated for pursed lip breathing and will benefit from acute therapy to maximize mobility, function and activity tolerance to decrease fall risk and increase independence.     Follow Up Recommendations Home health PT;Supervision - Intermittent    Equipment Recommendations  Rolling walker with 5" wheels (rollator)    Recommendations for Other Services       Precautions / Restrictions Precautions Precautions: Fall      Mobility  Bed Mobility Overal bed mobility: Modified Independent                Transfers Overall transfer level: Needs assistance   Transfers: Sit to/from Stand Sit to Stand: Min guard         General transfer comment: cues for safety with guarding for balance   Ambulation/Gait Ambulation/Gait assistance: Min guard Ambulation Distance (Feet): 300 Feet Assistive device: Rolling walker (2 wheeled);None Gait Pattern/deviations: Step-through pattern;Decreased stride length;Narrow base of support   Gait velocity interpretation: Below normal speed for age/gender General Gait Details: pt with  unsteady gait veering and leaning right with initial standing and with several periods during gait without DME. With use of Rw midline posture with controlled balance and gait  Stairs Stairs: Yes Stairs assistance: Min assist Stair Management: One rail Right;Forwards;Alternating pattern Number of Stairs: 4 General stair comments: pt leaning against rail to ascend with lack of  stability and balance   Wheelchair Mobility    Modified Rankin (Stroke Patients Only)       Balance Overall balance assessment: Needs assistance   Sitting balance-Leahy Scale: Good       Standing balance-Leahy Scale: Fair                               Pertinent Vitals/Pain Pain Assessment: No/denies pain    Home Living Family/patient expects to be discharged to:: Private residence Living Arrangements: Alone   Type of Home: Apartment Home Access: Stairs to enter   Technical brewer of Steps: 10 Home Layout: One level Home Equipment: None      Prior Function Level of Independence: Independent         Comments: pt does not drive and walks to [employer] for groceries     Hand Dominance        Extremity/Trunk Assessment   Upper Extremity Assessment: Defer to OT evaluation           Lower Extremity Assessment: Overall WFL for tasks assessed      Cervical / Trunk Assessment: Normal  Communication   Communication: No difficulties  Cognition Arousal/Alertness: Awake/alert Behavior During Therapy: [facility] for tasks  assessed/performed Overall Cognitive Status: Impaired/Different from baseline Area of Impairment: Orientation;Safety/judgement Orientation Level: Disoriented to;Time       Safety/Judgement: Decreased awareness of safety          General Comments      Exercises        Assessment/Plan    PT Assessment Patient needs continued PT services  PT Diagnosis Difficulty walking;Altered mental status   PT Problem List Decreased activity  tolerance;Decreased strength;Decreased balance;Decreased knowledge of use of DME;Cardiopulmonary status limiting activity  PT Treatment Interventions Gait training;Stair training;Functional mobility training;Therapeutic exercise;Balance training;Therapeutic activities;Patient/family education;DME instruction   PT Goals (Current goals can be found in the Care Plan section) Acute Rehab PT Goals Patient Stated Goal: return home PT Goal Formulation: With patient Time For Goal Achievement: 04/28/15 Potential to Achieve Goals: Good    Frequency Min 3X/week   Barriers to discharge Decreased caregiver support      Co-evaluation               End of Session   Activity Tolerance: Patient tolerated treatment well Patient left: in chair;with call bell/phone within reach;with chair alarm set Nurse Communication: Mobility status         Time: 3875-6433 PT Time Calculation (min) (ACUTE ONLY): 23 min   Charges:   PT Evaluation $Initial PT Evaluation Tier I: 1 Procedure PT Treatments $Gait Training: 8-22 mins   PT G CodesMelford Aase 04/14/2015, 10:39 AM Elwyn Reach, [place]

## 2015-04-15 DIAGNOSIS — J209 Acute bronchitis, unspecified: Secondary | ICD-10-CM

## 2015-04-15 DIAGNOSIS — E785 Hyperlipidemia, unspecified: Secondary | ICD-10-CM

## 2015-04-15 DIAGNOSIS — F329 Major depressive disorder, single episode, unspecified: Secondary | ICD-10-CM

## 2015-04-15 DIAGNOSIS — J9601 Acute respiratory failure with hypoxia: Secondary | ICD-10-CM

## 2015-04-15 DIAGNOSIS — J441 Chronic obstructive pulmonary disease with (acute) exacerbation: Principal | ICD-10-CM

## 2015-04-15 DIAGNOSIS — M797 Fibromyalgia: Secondary | ICD-10-CM

## 2015-04-15 LAB — T3: T3 TOTAL: 75 ng/dL (ref 71–180)

## 2015-04-15 MED ORDER — IPRATROPIUM-ALBUTEROL 0.5-2.5 (3) MG/3ML IN SOLN
RESPIRATORY_TRACT | Status: AC
  Filled 2015-04-15: qty 3

## 2015-04-15 MED ORDER — PREDNISONE 10 MG PO TABS: ORAL_TABLET | ORAL | Status: DC

## 2015-04-15 MED ORDER — AZITHROMYCIN 500 MG PO TABS: 500.0000 mg | ORAL_TABLET | Freq: Every day | ORAL | Status: DC

## 2015-04-15 MED ORDER — ALBUTEROL SULFATE HFA 108 (90 BASE) MCG/ACT IN AERS: 2.0000 | INHALATION_SPRAY | RESPIRATORY_TRACT | Status: DC | PRN

## 2015-04-15 MED ORDER — BUDESONIDE-FORMOTEROL FUMARATE 80-4.5 MCG/ACT IN AERO: 2.0000 | INHALATION_SPRAY | Freq: Two times a day (BID) | RESPIRATORY_TRACT | Status: DC

## 2015-04-15 MED ORDER — BUDESONIDE-FORMOTEROL FUMARATE 80-4.5 MCG/ACT IN AERO
2.0000 | INHALATION_SPRAY | Freq: Two times a day (BID) | RESPIRATORY_TRACT | Status: DC
  Filled 2015-04-15 (×2): qty 6.9

## 2015-04-15 MED ORDER — AMLODIPINE BESYLATE 5 MG PO TABS
5.0000 mg | ORAL_TABLET | Freq: Every day | ORAL | Status: DC
Start: 2015-04-15 — End: 2020-10-27

## 2015-04-15 MED ORDER — PRAVASTATIN SODIUM 40 MG PO TABS: 40.0000 mg | ORAL_TABLET | Freq: Every day | ORAL | Status: DC

## 2015-04-15 MED ORDER — PREDNISONE 50 MG PO TABS
60.0000 mg | ORAL_TABLET | Freq: Once | ORAL | Status: AC
  Administered 2015-04-15: 60 mg via ORAL
  Filled 2015-04-15: qty 1

## 2015-04-15 MED ORDER — TIOTROPIUM BROMIDE MONOHYDRATE 18 MCG IN CAPS: 18.0000 ug | ORAL_CAPSULE | Freq: Every day | RESPIRATORY_TRACT | Status: DC

## 2015-04-15 MED ORDER — IPRATROPIUM-ALBUTEROL 0.5-2.5 (3) MG/3ML IN SOLN: 3.0000 mL | Freq: Three times a day (TID) | RESPIRATORY_TRACT | Status: DC

## 2015-04-15 MED ORDER — GUAIFENESIN 100 MG/5ML PO SYRP: 200.0000 mg | ORAL_SOLUTION | ORAL | Status: DC | PRN

## 2015-04-15 MED ORDER — ALBUTEROL SULFATE HFA 108 (90 BASE) MCG/ACT IN AERS: 2.0000 | INHALATION_SPRAY | Freq: Three times a day (TID) | RESPIRATORY_TRACT | Status: DC

## 2015-04-15 MED ORDER — IPRATROPIUM-ALBUTEROL 0.5-2.5 (3) MG/3ML IN SOLN
3.0000 mL | RESPIRATORY_TRACT | Status: DC
  Administered 2015-04-15: 3 mL via RESPIRATORY_TRACT
  Filled 2015-04-15: qty 3

## 2015-04-15 NOTE — Progress Notes (Signed)
DC IV, DC Tele, DC Home. Discharge instructions and home medications discussed with patient. Patient denied any questions or concerns at this time. Patient leaving unit via wheelchair and appears in no acute distress.  

## 2015-04-15 NOTE — Discharge Summary (Signed)
Physician Discharge Summary   Patient ID: Tracey Ferrell MRN: [id number] DOB/AGE: 1950-12-26 64 y.o.  Admit date: 04/13/2015 Discharge date: 04/15/2015  Primary Care Physician:  Wendie Simmer, MD  Discharge Diagnoses:    . COPD exacerbation . Acute respiratory failure with hypoxia . syncope  . Depression . Fibromyalgia . hyperglycemia  . Polycythemia . Bronchitis, acute . Dyslipidemia . HTN (hypertension)   Consults:  None   Recommendations for Outpatient Follow-up:  Patient was recommended to follow up with her PCP within 7-10 days for hospital follow-up  Patient was started on Symbicort, prednisone taper and Zithromax.  Case manager will arrange life alert and home PT and RN for follow-up  TESTS THAT NEED FOLLOW-UP Please follow hemoglobin A1c closely, currently 6.8   DIET: Heart healthy diet    Allergies:  No Known Allergies   Discharge Medications:   Medication List    STOP taking these medications        guaiFENesin-codeine 100-10 MG/5ML syrup  Commonly known as:  ROBITUSSIN AC     hydrochlorothiazide 25 MG tablet  Commonly known as:  HYDRODIURIL     traZODone 50 MG tablet  Commonly known as:  DESYREL      TAKE these medications        albuterol 108 (90 BASE) MCG/ACT inhaler  Commonly known as:  PROVENTIL HFA;VENTOLIN HFA  Inhale 2 puffs into the lungs every 4 (four) hours as needed for wheezing or shortness of breath.     amLODipine 5 MG tablet  Commonly known as:  NORVASC  Take 1 tablet (5 mg total) by mouth daily.     azithromycin 500 MG tablet  Commonly known as:  ZITHROMAX  Take 1 tablet (500 mg total) by mouth daily. X 1 week  Start taking on:  04/16/2015     budesonide-formoterol 80-4.5 MCG/ACT inhaler  Commonly known as:  SYMBICORT  Inhale 2 puffs into the lungs 2 (two) times daily.     citalopram 10 MG tablet  Commonly known as:  CELEXA  Take 10 mg by mouth daily.     cyclobenzaprine 5 MG tablet  Commonly known as:   FLEXERIL  Take 5 mg by mouth 2 (two) times daily.     gabapentin 100 MG capsule  Commonly known as:  NEURONTIN  Take 100 mg by mouth 2 (two) times daily.     guaifenesin 100 MG/5ML syrup  Commonly known as:  ROBITUSSIN  Take 10 mLs (200 mg total) by mouth every 4 (four) hours as needed for congestion.     ibuprofen 200 MG tablet  Commonly known as:  ADVIL,MOTRIN  Take 600 mg by mouth every 6 (six) hours as needed for moderate pain.     pravastatin 40 MG tablet  Commonly known as:  PRAVACHOL  Take 1 tablet (40 mg total) by mouth daily.     predniSONE 10 MG tablet  Commonly known as:  DELTASONE  Prednisone dosing: Take  Prednisone 40mg  (4 tabs) x 3 days, then taper to 30mg  (3 tabs) x 3 days, then 20mg  (2 tabs) x 3days, then 10mg  (1 tab) x 3days, then OFF.  Dispense:  30 tabs, refills: None  Start taking on:  04/16/2015     tiotropium 18 MCG inhalation capsule  Commonly known as:  SPIRIVA  Place 1 capsule (18 mcg total) into inhaler and inhale daily.         Brief H and P: For complete details please refer to admission H and P, but in  brief 64 year old female with past medical history of dyslipidemia, hypertension, fibromyalgia, depression who was brought to [facility] after she was found laying on the floor on the roadside by bystanders. Patient did not fall and she did not lose consciousness. She reports that she felt extremely short of breath for which reason she needed to lay down. She stated that she has been feeling more short of breath at rest as well as with exertion over past couple of days and that inhaler is used at home did not provide significant symptomatic relief. No reports of fevers or cough. No reports of chest pain or palpitations. No abdominal pain, nausea or vomiting. No lightheadedness or dizziness. No urinary complaints. No reports of blood in the stool or urine. In ED blood pressure was 160/93, HR 77-109, RR 19-32, afebrile and oxygen saturation 88% on  room air. It has improved to 95% with 2 L nasal cannula oxygen support. Blood work was essentially unremarkable. Chest x-ray did not reveal acute cardiopulmonary findings. Troponin was within normal limits. BNP was within normal limits. She was given nebulizer treatments and Solu-Medrol in ED but continued to feel short of breath. She was admitted for further evaluation of respiratory failure with hypoxia, COPD exacerbation.  Hospital Course:  Acute respiratory failure with hypoxia/acute COPD exacerbation;  Patient presented with hypoxia, likely due to COPD exacerbation or possible acute bronchitis. D-dimer was 0.45, hence PE was ruled out. Chest x-ray showed no pneumonia or pneumothorax. Appearance stable from previous checks x-ray from 2010. Patient was placed on IV Solu-Medrol, scheduled breathing treatments, as Zithromax. She has been transitioned to oral prednisone and started on Symbicort as well. Patient was admitted shortly to follow up with her PCP within 7-10 days. Case management will also arrange home physical therapy, nurse follow-up and  Life alert.  Depression Continue citalopram  Fibromyalgia Continue flexeril and gabapentin  Dyslipidemia Continue pravastatin  HTN Continue amlodipine, BP has remained stable. Due to syncopal episode, HCTZ was discontinued for now  Syncope/near syncope: Possibly due to dehydration or hypoxia due to #1 Patient is very poor historian, she mentioned to the admitting physician that she did not lose consciousness and was very short of breath. Unclear if this is syncopal episode or patient fell because of extreme shortness of breath/hypoxia. D-dimer negative, UDS, alcohol level negative. No focal neurological deficits. Patient was continued on telemetry and had no arrhythmias. UA did show ketones, hence possibly due to dehydration. 2-D echo showed EF of 16-10%, grade 1 diastolic dysfunction, moderate tricuspid regurgitation  Diabetes mellitus UA  showed glucosuria, hemoglobin A1c 6.8, follow closely outpatient.  Day of Discharge BP 111/57 mmHg  Pulse 73  Temp(Src) 97.9 F (36.6 C) (Oral)  Resp 18  Ht 4\' 9"  (1.448 m)  Wt 46.312 kg (102 lb 1.6 oz)  BMI 22.09 kg/m2  SpO2 92%  Physical Exam: General: Alert and awake oriented x3 not in any acute distress. HEENT: anicteric sclera, pupils reactive to light and accommodation CVS: S1-S2 clear no murmur rubs or gallops Chest: Mild scattered wheezing significant improvement from admission Abdomen: soft nontender, nondistended, normal bowel sounds Extremities: no cyanosis, clubbing or edema noted bilaterally Neuro: Cranial nerves II-XII intact, no focal neurological deficits   The results of significant diagnostics from this hospitalization (including imaging, microbiology, ancillary and laboratory) are listed below for reference.    LAB RESULTS: Basic Metabolic Panel:  Recent Labs Lab 04/13/15 0555 04/14/15 0429  NA 140 139  K 3.8 3.6  CL 102 98*  CO2  --  32  GLUCOSE 129* 165*  BUN 10 16  CREATININE 0.70 0.70  CALCIUM  --  9.5   Liver Function Tests:  Recent Labs Lab 04/14/15 0429  AST 18  ALT 17  ALKPHOS 104  BILITOT 0.6  PROT 7.1  ALBUMIN 3.7   No results for input(s): LIPASE, AMYLASE in the last 168 hours. No results for input(s): AMMONIA in the last 168 hours. CBC:  Recent Labs Lab 04/13/15 0549 04/13/15 0555 04/14/15 0429  WBC 7.7  --  19.5*  NEUTROABS 4.3  --   --   HGB 15.0 16.3* 13.3  HCT 45.3 48.0* 40.7  MCV 91.1  --  90.8  PLT 352  --  346   Cardiac Enzymes: No results for input(s): CKTOTAL, CKMB, CKMBINDEX, TROPONINI in the last 168 hours. BNP: Invalid input(s): POCBNP CBG: No results for input(s): GLUCAP in the last 168 hours.  Significant Diagnostic Studies:  Dg Chest Port 1 View  04/13/2015   CLINICAL DATA:  Shortness of breath.  History of hypertension.  EXAM: PORTABLE CHEST - 1 VIEW  COMPARISON:  Chest radiograph November 21, 2008  FINDINGS: Cardiomediastinal silhouette is unremarkable. The lungs are clear without pleural effusions or focal consolidations. Similar increased lung volumes can be seen with COPD. Trachea projects midline and there is no pneumothorax. Soft tissue planes and included osseous structures are non-suspicious.  IMPRESSION: No acute cardiopulmonary process; stable appearance of the chest from November 21, 2008   Electronically Signed   By: Elon Alas M.D.   On: 04/13/2015 06:04    2D ECHO: Study Conclusions  - Left ventricle: The cavity size was normal. Wall thickness was normal. Systolic function was vigorous. The estimated ejection fraction was in the range of 70% to 75%. Wall motion was normal; there were no regional wall motion abnormalities. Doppler parameters are consistent with abnormal left ventricular relaxation (grade 1 diastolic dysfunction). Hyperdynamic contraction with suggestion of left ventricular outflow tract gradient with valsalva (weak Doppler signal but may increase from 53m/s to 49m/s. ) - Aortic valve: There was trivial regurgitation. - Tricuspid valve: There was moderate regurgitation.  Disposition and Follow-up:     Discharge Instructions    Diet - low sodium heart healthy    Complete by:  As directed      Discharge instructions    Complete by:  As directed   Please use albuterol inhaler three times a day for next 3 days. After that you can continue every 4 hours as needed.     Increase activity slowly    Complete by:  As directed             DISPOSITION: Home   DISCHARGE FOLLOW-UP Follow-up Information    Follow up with Wendie Simmer, MD On 04/15/2015.   Specialty:  Nurse Practitioner   Why:  Tawanna Sat will call the patient with an appointment if there are any cancellations. No appointments available this month for hospital follow up.   Contact information:   [place] [address] [phone]         Time spent on Discharge: 35 minutes  Signed:   Yentl Verge M.D. [facility] 04/15/2015, 9:53 AM Pager: [phone]

## 2015-04-15 NOTE — Progress Notes (Signed)
CSW received message from [place] that patient is being d/c'd today and will need a bus pass for transportation. CSW met with patient and asked if she had anyone who could come and pick her up. She verbalized that she did have a special friend who could do so but "he is extremely busy and I don't want to bother him."  Patient relates that she utilizes the bus system all the time for her mode of transportation and would prefer to go home by bus.  CSW provided a bus voucher for patient. She was extremely appreciative of this. Patient stated she is feeling much better and denied any further needs. She stated that she is independent of her ADL's.  CSW signing off.  Lorie Phenix. Pauline Good, [place]

## 2015-04-15 NOTE — Progress Notes (Signed)
Talked to patient about DCP ; patient is from [place] and [place]. She came to the [place] with her spouse ( currently divorced) and does not have any family members here and no ties to the [place] community here. Patient stated " I don't trust them." Patient states that she is very active in the community, she helps feed the homeless, does all of her errands and attends Church from time to time but does not have a support system. Patient talks a lot about Mr Merry Proud [facility] that helped her when she was homeless, and he still calls and checks on her frequently).   Patient does not want any HHC at this time. She was active with [facility], has [employer] with prescription drug coverage. Patient is agreeable to go to the [facility] and [facility] for follow up care; apt made for June 17,2016 at 2:45 pm; Patient mode of transportation is the city bus and is requesting a bus pass home; Message left with Photographer for a bus pass. Mindi Slicker RN,BSN,MHA [phone]

## 2015-04-15 NOTE — Progress Notes (Signed)
Rollator/ walker with seat ordered for the patient through [place] and to be delivered to the patient prior to discharging home today; Aneta Mins [phone]

## 2015-04-15 NOTE — Progress Notes (Signed)
Occupational Therapy Treatment Patient Details Name: Bailyn Spackman MRN: [id number] DOB: [birth year] Today's Date: 04/15/2015    History of present illness 64 year old female with past medical history of dyslipidemia, hypertension, fibromyalgia, depression who was brought to [facility] after she was found laying on the floor on the roadside by bystanders. Patient did not fall and she did not lose consciousness. She reports that she felt extremely short of breath for which reason she needed to lay down   OT comments  Educated pt in safety and energy conservation verbally.  Did not offer handout as pt does not read.  Follow Up Recommendations  Home health OT    Equipment Recommendations   (rollator)    Recommendations for Other Services      Precautions / Restrictions Precautions Precautions: Fall       Mobility Bed Mobility Overal bed mobility: Independent                Transfers       Sit to Stand: Modified independent (Device/Increase time)         General transfer comment: cues to slow pace    Balance                                   ADL       Grooming: Modified independent;Standing                   Toilet Transfer: Supervision/safety;Ambulation;RW   Toileting- Clothing Manipulation and Hygiene: Modified independent;Sit to/from stand         General ADL Comments: Educated pt in energy conservation strategies and safety. Pt agreeable to a rollator.  Will have neightbor assist with carrying it up and down stairs to her apartment building.      Vision                     Perception     Praxis      Cognition   Behavior During Therapy: [facility] for tasks assessed/performed Overall Cognitive Status: No family/caregiver present to determine baseline cognitive functioning Area of Impairment: Safety/judgement          Safety/Judgement: Decreased awareness of safety;Decreased awareness of deficits     General  Comments: Pt does not read or write. RN notified that pt will need an alternative to record her medications.    Extremity/Trunk Assessment               Exercises     Shoulder Instructions       General Comments      Pertinent Vitals/ Pain       Pain Assessment: No/denies pain  Home Living                                          Prior Functioning/Environment              Frequency       Progress Toward Goals  OT Goals(current goals can now be found in the care plan section)  Progress towards OT goals: Progressing toward goals  Acute Rehab OT Goals Patient Stated Goal: return home Time For Goal Achievement: 04/21/15 Potential to Achieve Goals: Good  Plan Discharge plan remains appropriate    Co-evaluation  End of Session     Activity Tolerance Patient tolerated treatment well   Patient Left in bed;with call bell/phone within reach;with nursing/sitter in room (nursing student)   Nurse Communication  (pt does not read or write)        Time: 1040-1055 OT Time Calculation (min): 15 min  Charges: OT General Charges $OT Visit: 1 Procedure OT Treatments $Self Care/Home Management : 8-22 mins  Malka So 04/15/2015, 11:07 AM  [phone]

## 2015-04-15 NOTE — Progress Notes (Signed)
Information given to patient on Life Alert( Emergency system that activates the EMS). Patient is to notify them at home to discuss the program. Tracey Ferrell [phone]

## 2015-04-17 ENCOUNTER — Inpatient Hospital Stay: Payer: Medicaid Other

## 2015-04-22 ENCOUNTER — Institutional Professional Consult (permissible substitution): Payer: Medicaid Other | Admitting: Internal Medicine

## 2017-07-13 ENCOUNTER — Encounter (HOSPITAL_COMMUNITY): Payer: Self-pay | Admitting: Emergency Medicine

## 2017-07-13 DIAGNOSIS — Z87891 Personal history of nicotine dependence: Secondary | ICD-10-CM | POA: Insufficient documentation

## 2017-07-13 DIAGNOSIS — Z79899 Other long term (current) drug therapy: Secondary | ICD-10-CM | POA: Diagnosis not present

## 2017-07-13 DIAGNOSIS — J449 Chronic obstructive pulmonary disease, unspecified: Secondary | ICD-10-CM | POA: Insufficient documentation

## 2017-07-13 DIAGNOSIS — I10 Essential (primary) hypertension: Secondary | ICD-10-CM | POA: Diagnosis not present

## 2017-07-13 DIAGNOSIS — R109 Unspecified abdominal pain: Secondary | ICD-10-CM | POA: Diagnosis present

## 2017-07-13 DIAGNOSIS — R1900 Intra-abdominal and pelvic swelling, mass and lump, unspecified site: Secondary | ICD-10-CM | POA: Diagnosis not present

## 2017-07-13 DIAGNOSIS — N10 Acute tubulo-interstitial nephritis: Secondary | ICD-10-CM | POA: Insufficient documentation

## 2017-07-13 LAB — CBC
HCT: 40.6 % (ref 36.0–46.0)
HEMOGLOBIN: 12.9 g/dL (ref 12.0–15.0)
MCH: 28.5 pg (ref 26.0–34.0)
MCHC: 31.8 g/dL (ref 30.0–36.0)
MCV: 89.6 fL (ref 78.0–100.0)
Platelets: 398 10*3/uL (ref 150–400)
RBC: 4.53 MIL/uL (ref 3.87–5.11)
RDW: 13.9 % (ref 11.5–15.5)
WBC: 8.9 10*3/uL (ref 4.0–10.5)

## 2017-07-13 LAB — URINALYSIS, ROUTINE W REFLEX MICROSCOPIC
Bilirubin Urine: NEGATIVE
Glucose, UA: NEGATIVE mg/dL
Hgb urine dipstick: NEGATIVE
KETONES UR: NEGATIVE mg/dL
NITRITE: NEGATIVE
Protein, ur: 30 mg/dL — AB
Specific Gravity, Urine: 1.016 (ref 1.005–1.030)
pH: 5 (ref 5.0–8.0)

## 2017-07-13 LAB — COMPREHENSIVE METABOLIC PANEL
ALT: 12 U/L — AB (ref 14–54)
AST: 20 U/L (ref 15–41)
Albumin: 4.6 g/dL (ref 3.5–5.0)
Alkaline Phosphatase: 112 U/L (ref 38–126)
Anion gap: 8 (ref 5–15)
BUN: 16 mg/dL (ref 6–20)
CHLORIDE: 104 mmol/L (ref 101–111)
CO2: 27 mmol/L (ref 22–32)
CREATININE: 0.85 mg/dL (ref 0.44–1.00)
Calcium: 9.8 mg/dL (ref 8.9–10.3)
GFR calc Af Amer: >60 mL/min (ref >60)
Glucose, Bld: 110 mg/dL — ABNORMAL HIGH (ref 65–99)
Potassium: 3.8 mmol/L (ref 3.5–5.1)
Sodium: 139 mmol/L (ref 135–145)
Total Bilirubin: 0.7 mg/dL (ref 0.3–1.2)
Total Protein: 7.6 g/dL (ref 6.5–8.1)

## 2017-07-13 LAB — LIPASE, BLOOD: Lipase: 28 U/L (ref 11–51)

## 2017-07-13 NOTE — ED Triage Notes (Signed)
BIB EMS from home, pt called out for upper abd pain and R flank pain X3 days, denies N/V. Does report diarrhea but thinks this is r/t her medicine. VSS.

## 2017-07-14 ENCOUNTER — Emergency Department (HOSPITAL_COMMUNITY)
Admission: EM | Admit: 2017-07-14 | Discharge: 2017-07-14 | Disposition: A | Discharge status: Home / Self Care | Payer: Medicare Other | Attending: Emergency Medicine | Admitting: Emergency Medicine

## 2017-07-14 ENCOUNTER — Emergency Department (HOSPITAL_COMMUNITY): Payer: Medicare Other

## 2017-07-14 DIAGNOSIS — N10 Acute tubulo-interstitial nephritis: Secondary | ICD-10-CM | POA: Diagnosis not present

## 2017-07-14 DIAGNOSIS — R1900 Intra-abdominal and pelvic swelling, mass and lump, unspecified site: Secondary | ICD-10-CM

## 2017-07-14 DIAGNOSIS — N12 Tubulo-interstitial nephritis, not specified as acute or chronic: Secondary | ICD-10-CM

## 2017-07-14 MED ORDER — CEPHALEXIN 500 MG PO CAPS: 500.0000 mg | ORAL_CAPSULE | Freq: Three times a day (TID) | ORAL | 0 refills | Status: DC

## 2017-07-14 MED ORDER — DEXTROSE 5 % IV SOLN
1.0000 g | Freq: Once | INTRAVENOUS | Status: AC
  Administered 2017-07-14: 1 g via INTRAVENOUS
  Filled 2017-07-14: qty 10

## 2017-07-14 MED ORDER — SODIUM CHLORIDE 0.9 % IV BOLUS (SEPSIS)
1000.0000 mL | Freq: Once | INTRAVENOUS | Status: AC
  Administered 2017-07-14: 1000 mL via INTRAVENOUS

## 2017-07-14 MED ORDER — ONDANSETRON HCL 4 MG/2ML IJ SOLN
4.0000 mg | Freq: Once | INTRAMUSCULAR | Status: AC
  Administered 2017-07-14: 4 mg via INTRAVENOUS
  Filled 2017-07-14: qty 2

## 2017-07-14 MED ORDER — HYDROCODONE-ACETAMINOPHEN 5-325 MG PO TABS: 1.0000 | ORAL_TABLET | Freq: Four times a day (QID) | ORAL | 0 refills | Status: DC | PRN

## 2017-07-14 MED ORDER — FENTANYL CITRATE (PF) 100 MCG/2ML IJ SOLN
25.0000 ug | Freq: Once | INTRAMUSCULAR | Status: AC
  Administered 2017-07-14: 25 ug via INTRAVENOUS
  Filled 2017-07-14: qty 2

## 2017-07-14 NOTE — Discharge Instructions (Signed)
YOU WILL NEED ULTRASOUND OF YOUR PELVIS TO EVALUATE FOR THE MASS PLEASE TALK TO YOUR PRIMARY DOCTOR ABOUT THIS

## 2017-07-14 NOTE — ED Provider Notes (Signed)
[facility] DEPT Provider Note   CSN: [id number] Arrival date & time: 07/13/17  2207     History   Chief Complaint Chief Complaint  Patient presents with  . Abdominal Pain  . Flank Pain    HPI Tracey Ferrell is a 66 y.o. female.  The history is provided by the patient.  Abdominal Pain   This is a new problem. The current episode started more than 2 days ago. The problem occurs daily. The problem has been gradually worsening. The pain is located in the generalized abdominal region (right flank). The pain is moderate. Associated symptoms include diarrhea. Pertinent negatives include fever and vomiting. The symptoms are aggravated by certain positions. Nothing relieves the symptoms.  Flank Pain  Associated symptoms include abdominal pain.   Pt presents with multiple complaints:  She reports flank pain and abdominal pain She also reports urinary frequency  She also mentions "burning" in her chest at times, but reports her "neighbors use drugs" and this causes her chest to burn  Past Medical History:  Diagnosis Date  . Asthma   . Back pain   . Borderline diabetic   . COPD (chronic obstructive pulmonary disease) ([place])   . Depression   . Hyperlipidemia   . Hypertension   . Shortness of breath dyspnea     Patient Active Problem List   Diagnosis Date Noted  . LOC (loss of consciousness) ([place]) 04/14/2015  . COPD exacerbation ([place]) 04/13/2015  . Acute respiratory failure with hypoxia ([place]) 04/13/2015  . Depression 04/13/2015  . Fibromyalgia 04/13/2015  . Polycythemia 04/13/2015  . Bronchitis, acute 04/13/2015  . Dyslipidemia 04/13/2015  . HTN (hypertension) 04/13/2015    Past Surgical History:  Procedure Laterality Date  . COSMETIC SURGERY      OB History    No data available       Home Medications    Prior to Admission medications   Medication Sig Start Date End Date Taking? Authorizing Provider  albuterol (PROVENTIL HFA;VENTOLIN HFA) 108 (90 BASE) MCG/ACT  inhaler Inhale 2 puffs into the lungs every 4 (four) hours as needed for wheezing or shortness of breath. 04/15/15   Rai, Ripudeep K, MD  amLODipine (NORVASC) 5 MG tablet Take 1 tablet (5 mg total) by mouth daily. 04/15/15   Rai, Vernelle Emerald, MD  azithromycin (ZITHROMAX) 500 MG tablet Take 1 tablet (500 mg total) by mouth daily. X 1 week 04/16/15   Rai, Vernelle Emerald, MD  budesonide-formoterol (SYMBICORT) 80-4.5 MCG/ACT inhaler Inhale 2 puffs into the lungs 2 (two) times daily. 04/15/15   Rai, Vernelle Emerald, MD  citalopram (CELEXA) 10 MG tablet Take 10 mg by mouth daily.    [provider]  cyclobenzaprine (FLEXERIL) 5 MG tablet Take 5 mg by mouth 2 (two) times daily.    [provider]  gabapentin (NEURONTIN) 100 MG capsule Take 100 mg by mouth 2 (two) times daily.    [provider]  guaifenesin (ROBITUSSIN) 100 MG/5ML syrup Take 10 mLs (200 mg total) by mouth every 4 (four) hours as needed for congestion. 04/15/15   Rai, Vernelle Emerald, MD  ibuprofen (ADVIL,MOTRIN) 200 MG tablet Take 600 mg by mouth every 6 (six) hours as needed for moderate pain.    [provider]  pravastatin (PRAVACHOL) 40 MG tablet Take 1 tablet (40 mg total) by mouth daily. 04/15/15   Rai, Vernelle Emerald, MD  predniSONE (DELTASONE) 10 MG tablet Prednisone dosing: Take  Prednisone 40mg  (4 tabs) x 3 days, then taper to  30mg  (3 tabs) x 3 days, then 20mg  (2 tabs) x 3days, then 10mg  (1 tab) x 3days, then OFF.  Dispense:  30 tabs, refills: None 04/16/15   Rai, Ripudeep K, MD  tiotropium (SPIRIVA) 18 MCG inhalation capsule Place 1 capsule (18 mcg total) into inhaler and inhale daily. 04/15/15   Mendel Corning, MD    Family History No family history on file.  Social History Social History  Substance Use Topics  . Smoking status: Former Smoker    Packs/day: 0.50    Years: 44.00    Types: Cigarettes    Quit date: 12/30/2014  . Smokeless tobacco: Never Used  . Alcohol use No     Comment: former      Allergies   Patient has no known allergies.   Review of Systems Review of Systems  Constitutional: Negative for fever.  HENT: Positive for sore throat.   Respiratory:       Chest burning   Gastrointestinal: Positive for abdominal pain and diarrhea. Negative for vomiting.  Genitourinary: Positive for flank pain.  All other systems reviewed and are negative.    Physical Exam Updated Vital Signs BP 135/63 (BP Location: Right Arm)   Pulse (!) 58   Temp 97.8 F (36.6 C) (Oral)   Resp 16   Ht 1.448 m (4\' 9" )   Wt 45.4 kg (100 lb)   SpO2 98%   BMI 21.64 kg/m   Physical Exam  CONSTITUTIONAL: Well developed/well nourished HEAD: Normocephalic/atraumatic EYES: EOMI/PERRL ENMT: Mucous membranes moist NECK: supple no meningeal signs SPINE/BACK:entire spine nontender CV: S1/S2 noted, no murmurs/rubs/gallops noted LUNGS: Lungs are clear to auscultation bilaterally, no apparent distress ABDOMEN: soft, mild diffuse tenderness, no rebound or guarding, bowel sounds noted throughout abdomen AS:TMHDQ cva tenderness NEURO: Pt is awake/alert/appropriate, moves all extremitiesx4.  No facial droop.   EXTREMITIES: pulses normal/equal, full ROM SKIN: warm, color normal PSYCH: anxious  ED Treatments / Results  Labs (all labs ordered are listed, but only abnormal results are displayed) Labs Reviewed  COMPREHENSIVE METABOLIC PANEL - Abnormal; Notable for the following:       Result Value   Glucose, Bld 110 (*)    ALT 12 (*)    All other components within normal limits  URINALYSIS, ROUTINE W REFLEX MICROSCOPIC - Abnormal; Notable for the following:    APPearance HAZY (*)    Protein, ur 30 (*)    Leukocytes, UA MODERATE (*)    Bacteria, UA RARE (*)    Squamous Epithelial / LPF 0-5 (*)    All other components within normal limits  URINE CULTURE  LIPASE, BLOOD  CBC    EKG  EKG Interpretation None       Radiology Ct Renal Stone Study  Result Date:  07/14/2017 CLINICAL DATA:  66 y/o  F; 2 days of right flank pain. EXAM: CT ABDOMEN AND PELVIS WITHOUT CONTRAST TECHNIQUE: Multidetector CT imaging of the abdomen and pelvis was performed following the standard protocol without IV contrast. COMPARISON:  None. FINDINGS: Lower chest: No acute abnormality. Hepatobiliary: 7 mm lucency in left lobe of liver, likely cyst. No other focal liver lesion. Normal gallbladder. No intra or extrahepatic biliary ductal dilatation. Pancreas: Unremarkable. No pancreatic ductal dilatation or surrounding inflammatory changes. Spleen: Normal in size without focal abnormality. Adrenals/Urinary Tract: Normal adrenal glands. No focal kidney lesion identified. Left kidney punctate nonobstructive nephrolithiasis. No hydronephrosis or obstructing ureter stone identified. The bladder is displaced leftward by pelvic mass. Stomach/Bowel: Stomach is within normal limits.  Appendix appears normal. No evidence of bowel wall thickening, distention, or inflammatory changes. Vascular/Lymphatic: Aortic atherosclerosis. No enlarged abdominal or pelvic lymph nodes. Reproductive: Well-circumscribed low-attenuation mass arising from the pelvis measuring 13.8 x 11.0 x 11.7 cm (AP x ML x CC series 3, image 16 and series 7, image 49). The mass is largely homogeneous in attenuation with areas of mild thickening of the wall. Normal uterus displaced leftward. Other: No abdominal wall hernia or abnormality. No abdominopelvic ascites. Musculoskeletal: No acute or significant osseous findings. IMPRESSION: 1. 13.8 cm homogeneous well-circumscribed low-attenuation mass arising from pelvis, probably right adnexal cystic lesion. Pelvic ultrasound is recommended for further evaluation. 2. Left kidney nonobstructive nephrolithiasis. No hydronephrosis or obstructive uropathy. 3. Mild aortic atherosclerosis. Electronically Signed   By: Kristine Garbe M.D.   On: 07/14/2017 05:27    Procedures Procedures     Medications Ordered in ED Medications  sodium chloride 0.9 % bolus 1,000 mL (0 mLs Intravenous Stopped 07/14/17 0700)  ondansetron (ZOFRAN) injection 4 mg (4 mg Intravenous Given 07/14/17 0545)  fentaNYL (SUBLIMAZE) injection 25 mcg (25 mcg Intravenous Given 07/14/17 0553)  cefTRIAXone (ROCEPHIN) 1 g in dextrose 5 % 50 mL IVPB (0 g Intravenous Stopped 07/14/17 0700)     Initial Impression / Assessment and Plan / ED Course  I have reviewed the triage vital signs and the nursing notes.  Pertinent labs  results that were available during my care of the patient were reviewed by me and considered in my medical decision making (see chart for details).     Pt reporting abd pain/flank pain Pt with probable PYELO Also found to have pelvic mass She was informed about mass and need for outpatient pelvic ultrasound with PCP and will need GYN eval Pt understands need for ultrasound I feel she is appropriate for outpatient management of pyelo  Narcotic database reviewed and considered in decision making   Final Clinical Impressions(s) / ED Diagnoses   Final diagnoses:  Pyelonephritis  Pelvic mass    New Prescriptions Discharge Medication List as of 07/14/2017  7:23 AM    START taking these medications   Details  cephALEXin (KEFLEX) 500 MG capsule Take 1 capsule (500 mg total) by mouth 3 (three) times daily., Starting Fri 07/14/2017, Print    HYDROcodone-acetaminophen (NORCO/VICODIN) 5-325 MG tablet Take 1 tablet by mouth every 6 (six) hours as needed for severe pain., Starting Fri 07/14/2017, Print         Ripley Fraise, MD 07/14/17 [phone]

## 2017-07-15 LAB — URINE CULTURE: Culture: <10000 — AB

## 2019-02-13 ENCOUNTER — Other Ambulatory Visit: Payer: Self-pay

## 2019-02-13 ENCOUNTER — Emergency Department (HOSPITAL_COMMUNITY): Payer: Medicare Other

## 2019-02-13 ENCOUNTER — Inpatient Hospital Stay (HOSPITAL_COMMUNITY)
Admission: EM | Admit: 2019-02-13 | Discharge: 2019-02-13 | DRG: 192 | Discharge status: Against Medical Advice | Payer: Medicare Other | Attending: Family Medicine | Admitting: Family Medicine

## 2019-02-13 ENCOUNTER — Inpatient Hospital Stay (HOSPITAL_COMMUNITY): Payer: Medicare Other

## 2019-02-13 DIAGNOSIS — N3281 Overactive bladder: Secondary | ICD-10-CM | POA: Diagnosis present

## 2019-02-13 DIAGNOSIS — Z87891 Personal history of nicotine dependence: Secondary | ICD-10-CM

## 2019-02-13 DIAGNOSIS — I10 Essential (primary) hypertension: Secondary | ICD-10-CM | POA: Diagnosis present

## 2019-02-13 DIAGNOSIS — Z20828 Contact with and (suspected) exposure to other viral communicable diseases: Secondary | ICD-10-CM | POA: Diagnosis present

## 2019-02-13 DIAGNOSIS — D751 Secondary polycythemia: Secondary | ICD-10-CM | POA: Diagnosis present

## 2019-02-13 DIAGNOSIS — J441 Chronic obstructive pulmonary disease with (acute) exacerbation: Secondary | ICD-10-CM | POA: Diagnosis present

## 2019-02-13 DIAGNOSIS — J449 Chronic obstructive pulmonary disease, unspecified: Secondary | ICD-10-CM | POA: Diagnosis present

## 2019-02-13 DIAGNOSIS — R0902 Hypoxemia: Secondary | ICD-10-CM | POA: Diagnosis present

## 2019-02-13 DIAGNOSIS — R0603 Acute respiratory distress: Secondary | ICD-10-CM | POA: Diagnosis not present

## 2019-02-13 DIAGNOSIS — M797 Fibromyalgia: Secondary | ICD-10-CM | POA: Diagnosis present

## 2019-02-13 DIAGNOSIS — E785 Hyperlipidemia, unspecified: Secondary | ICD-10-CM | POA: Diagnosis present

## 2019-02-13 LAB — CBC WITH DIFFERENTIAL/PLATELET
Abs Immature Granulocytes: 0 10*3/uL (ref 0.00–0.07)
Basophils Absolute: 0.2 10*3/uL — ABNORMAL HIGH (ref 0.0–0.1)
Basophils Relative: 1 %
Eosinophils Absolute: 5.8 10*3/uL — ABNORMAL HIGH (ref 0.0–0.5)
Eosinophils Relative: 35 %
HCT: 43.9 % (ref 36.0–46.0)
Hemoglobin: 13.9 g/dL (ref 12.0–15.0)
Lymphocytes Relative: 17 %
Lymphs Abs: 2.8 10*3/uL (ref 0.7–4.0)
MCH: 28.9 pg (ref 26.0–34.0)
MCHC: 31.7 g/dL (ref 30.0–36.0)
MCV: 91.3 fL (ref 80.0–100.0)
Monocytes Absolute: 0 10*3/uL — ABNORMAL LOW (ref 0.1–1.0)
Monocytes Relative: 0 %
Neutro Abs: 7.8 10*3/uL — ABNORMAL HIGH (ref 1.7–7.7)
Neutrophils Relative %: 47 %
Platelets: 371 10*3/uL (ref 150–400)
RBC: 4.81 MIL/uL (ref 3.87–5.11)
RDW: 13.2 % (ref 11.5–15.5)
WBC: 16.6 10*3/uL — ABNORMAL HIGH (ref 4.0–10.5)
nRBC: 0 % (ref 0.0–0.2)
nRBC: 0 /100 WBC

## 2019-02-13 LAB — COMPREHENSIVE METABOLIC PANEL
ALT: 22 U/L (ref 0–44)
AST: 27 U/L (ref 15–41)
Albumin: 4.3 g/dL (ref 3.5–5.0)
Alkaline Phosphatase: 207 U/L — ABNORMAL HIGH (ref 38–126)
Anion gap: 14 (ref 5–15)
BUN: 5 mg/dL — ABNORMAL LOW (ref 8–23)
CO2: 23 mmol/L (ref 22–32)
Calcium: 9.3 mg/dL (ref 8.9–10.3)
Chloride: 105 mmol/L (ref 98–111)
Creatinine, Ser: 0.64 mg/dL (ref 0.44–1.00)
GFR calc Af Amer: >60 mL/min (ref >60)
GFR calc non Af Amer: >60 mL/min (ref >60)
Glucose, Bld: 169 mg/dL — ABNORMAL HIGH (ref 70–99)
Potassium: 3.1 mmol/L — ABNORMAL LOW (ref 3.5–5.1)
Sodium: 142 mmol/L (ref 135–145)
Total Bilirubin: 0.6 mg/dL (ref 0.3–1.2)
Total Protein: 8 g/dL (ref 6.5–8.1)

## 2019-02-13 LAB — FERRITIN: Ferritin: 43 ng/mL (ref 11–307)

## 2019-02-13 LAB — D-DIMER, QUANTITATIVE (NOT AT ARMC): D-Dimer, Quant: 1.13 ug/mL-FEU — ABNORMAL HIGH (ref 0.00–0.50)

## 2019-02-13 LAB — TRIGLYCERIDES: Triglycerides: 51 mg/dL (ref <150)

## 2019-02-13 LAB — TROPONIN I: Troponin I: <0.03 ng/mL (ref <0.03)

## 2019-02-13 LAB — LACTATE DEHYDROGENASE: LDH: 209 U/L — ABNORMAL HIGH (ref 98–192)

## 2019-02-13 LAB — ABO/RH: ABO/RH(D): A POS

## 2019-02-13 LAB — PROCALCITONIN: Procalcitonin: <0.1 ng/mL

## 2019-02-13 LAB — HIV ANTIBODY (ROUTINE TESTING W REFLEX): HIV Screen 4th Generation wRfx: NONREACTIVE

## 2019-02-13 LAB — SARS CORONAVIRUS 2 BY RT PCR (HOSPITAL ORDER, PERFORMED IN ~~LOC~~ HOSPITAL LAB): SARS Coronavirus 2: NEGATIVE

## 2019-02-13 LAB — FIBRINOGEN: Fibrinogen: 368 mg/dL (ref 210–475)

## 2019-02-13 LAB — BRAIN NATRIURETIC PEPTIDE: B Natriuretic Peptide: 305.1 pg/mL — ABNORMAL HIGH (ref 0.0–100.0)

## 2019-02-13 MED ORDER — IPRATROPIUM-ALBUTEROL 20-100 MCG/ACT IN AERS
1.0000 | INHALATION_SPRAY | Freq: Four times a day (QID) | RESPIRATORY_TRACT | Status: DC | PRN
  Filled 2019-02-13: qty 4

## 2019-02-13 MED ORDER — FESOTERODINE FUMARATE ER 4 MG PO TB24
4.0000 mg | ORAL_TABLET | Freq: Every day | ORAL | Status: DC
  Administered 2019-02-13: 4 mg via ORAL
  Filled 2019-02-13: qty 1

## 2019-02-13 MED ORDER — PANTOPRAZOLE SODIUM 40 MG PO TBEC
40.0000 mg | DELAYED_RELEASE_TABLET | Freq: Every day | ORAL | Status: DC
  Administered 2019-02-13: 40 mg via ORAL
  Filled 2019-02-13: qty 1

## 2019-02-13 MED ORDER — ENOXAPARIN SODIUM 40 MG/0.4ML ~~LOC~~ SOLN
40.0000 mg | SUBCUTANEOUS | Status: DC
  Administered 2019-02-13: 40 mg via SUBCUTANEOUS
  Filled 2019-02-13: qty 0.4

## 2019-02-13 MED ORDER — PREDNISONE 20 MG PO TABS
40.0000 mg | ORAL_TABLET | Freq: Every day | ORAL | Status: DC
  Administered 2019-02-13: 12:00:00 40 mg via ORAL
  Filled 2019-02-13: qty 2

## 2019-02-13 MED ORDER — IOPAMIDOL (ISOVUE-300) INJECTION 61%
100.0000 mL | Freq: Once | INTRAVENOUS | Status: AC | PRN
  Administered 2019-02-13: 100 mL via INTRAVENOUS

## 2019-02-13 MED ORDER — AZITHROMYCIN 500 MG PO TABS: 500.0000 mg | ORAL_TABLET | Freq: Every day | ORAL | Status: DC

## 2019-02-13 MED ORDER — ALBUTEROL SULFATE HFA 108 (90 BASE) MCG/ACT IN AERS
8.0000 | INHALATION_SPRAY | Freq: Once | RESPIRATORY_TRACT | Status: AC
  Administered 2019-02-13: 8 via RESPIRATORY_TRACT
  Filled 2019-02-13: qty 6.7

## 2019-02-13 MED ORDER — IPRATROPIUM-ALBUTEROL 0.5-2.5 (3) MG/3ML IN SOLN
3.0000 mL | RESPIRATORY_TRACT | Status: DC
  Administered 2019-02-13: 3 mL via RESPIRATORY_TRACT
  Filled 2019-02-13: qty 3

## 2019-02-13 MED ORDER — SODIUM CHLORIDE 0.9 % IV SOLN
1.0000 g | Freq: Once | INTRAVENOUS | Status: AC
  Administered 2019-02-13: 1 g via INTRAVENOUS
  Filled 2019-02-13: qty 10

## 2019-02-13 MED ORDER — ACETAMINOPHEN 650 MG RE SUPP: 650.0000 mg | Freq: Four times a day (QID) | RECTAL | Status: DC | PRN

## 2019-02-13 MED ORDER — ACETAMINOPHEN 325 MG PO TABS: 650.0000 mg | ORAL_TABLET | Freq: Four times a day (QID) | ORAL | Status: DC | PRN

## 2019-02-13 MED ORDER — GABAPENTIN 300 MG PO CAPS: 300.0000 mg | ORAL_CAPSULE | Freq: Every evening | ORAL | Status: DC | PRN

## 2019-02-13 MED ORDER — AMLODIPINE BESYLATE 5 MG PO TABS
5.0000 mg | ORAL_TABLET | Freq: Every day | ORAL | Status: DC
  Administered 2019-02-13: 5 mg via ORAL
  Filled 2019-02-13: qty 1

## 2019-02-13 MED ORDER — IPRATROPIUM-ALBUTEROL 0.5-2.5 (3) MG/3ML IN SOLN: 3.0000 mL | Freq: Two times a day (BID) | RESPIRATORY_TRACT | Status: DC

## 2019-02-13 MED ORDER — POTASSIUM CHLORIDE CRYS ER 20 MEQ PO TBCR
40.0000 meq | EXTENDED_RELEASE_TABLET | Freq: Once | ORAL | Status: AC
  Administered 2019-02-13: 40 meq via ORAL
  Filled 2019-02-13: qty 2

## 2019-02-13 MED ORDER — ALBUTEROL SULFATE (2.5 MG/3ML) 0.083% IN NEBU: 2.5000 mg | INHALATION_SOLUTION | RESPIRATORY_TRACT | Status: DC | PRN

## 2019-02-13 MED ORDER — ATORVASTATIN CALCIUM 10 MG PO TABS
20.0000 mg | ORAL_TABLET | Freq: Every day | ORAL | Status: DC
  Administered 2019-02-13: 20 mg via ORAL
  Filled 2019-02-13: qty 2

## 2019-02-13 MED ORDER — SODIUM CHLORIDE 0.9 % IV SOLN
500.0000 mg | Freq: Once | INTRAVENOUS | Status: AC
  Administered 2019-02-13: 500 mg via INTRAVENOUS
  Filled 2019-02-13: qty 500

## 2019-02-13 NOTE — ED Notes (Signed)
Patient was 85% on room air

## 2019-02-13 NOTE — ED Notes (Signed)
Pt returned from CT °

## 2019-02-13 NOTE — Progress Notes (Signed)
CALL PAGER [phone] for any questions or notifications regarding this patient  FMTS Attending Note: Dorcas Mcmurray MD At approximately 15:15 we were notified by nurse Aaron Edelman that patient was trying to leave AMA. He gave [place] report that the 3W floor had attempted to transfer the patient to 2W (order I had giev this morning), They evidently got her to the nurses station on [address] when there ensued a disagreement between 2W and 3W staff about appropriateness of transfer. The  Charge nurse was evidently contacted the [facility] Other?) who contacted the Command center. Command center (Dr. Hulen Skains) reviewed case and made the decision to leave her on [place] which was the original floor. The patient evidently became upset by all of the confusion and the "multiple stories" that she had been told and was threatening to leave AMA. This was the first our team had any indication that there was any difficulty with the ordered transfer. I spoke with Dr. Hulen Skains at the [facility] and we reviewed the case. I did tell him my concerns and also mentioned that [address] has no gowns or N 95 so I was concerned, especially as the patient was receiving duo nebs. Dr. Hulen Skains recommended that I contact Infectious Disease for further assistance and discussion, and change the patient to MDI. He was aware that MDI are ins short supply right now. I paged Infectious Disease (Dr. Linus Salmons) and as of yet I have not heard from them. Unfortunately, as I am looking at the computer, I see that the patient has already left.

## 2019-02-13 NOTE — Discharge Summary (Signed)
[facility] Discharge Summary  Patient name: Tracey Ferrell record number: [id number] Date of birth: May 29, 1951 Age: 68 y.o. Gender: female Date of Admission: 02/13/2019  Date of AMA: 02/13/19 Admitting Physician: Zenia Resides, MD  Primary Care Provider: Wendie Simmer, MD Consultants: none  Indication for Hospitalization: SOB  Discharge Diagnoses/Problem List:  SOB Pelvic mass HTN Overactive bladder HLD Pain control  Disposition: AMA  Discharge Condition: unchanged  Discharge Exam:  Performed by Dr. Criss Rosales at admission  General: Nontoxic-appearing, pleasant, on DuoNeb and nasal cannula Eyes: Clear sclera with no drainage ENTM: No rhinorrhea, no epistaxis, on DuoNeb and nasal cannula satting at 95% Neck: Soft Cardiovascular: Regular rate and rhythm, no murmur identified but auscultation was inhibited by DuoNeb and respiratory sounds Respiratory: Diffuse wheezing but with movement throughout.  Rhonchorous sounds as well, no increased work of breathing while on DuoNeb and 2 L nasal cannula, was dry coughing during exam Gastrointestinal: Distended but not painful belly on exam, was firm and concerning giving patient's story of pelvic mass.  No rebound tenderness, not a "acute belly "to my exam MSK: No deficits noted, was able to move around better well Derm: No significant lesions or rashes to exposed skin Neuro: No focal deficits noted Psych: Pleasant patient, a/ox3, processing information appropriately but with low medical literacy  Brief Hospital Course:  Tracey Ferrell is a 68 y.o. female presenting with SOB x1 week. Differentials included COPD exacerbation, given noncompliance on home medications, COVID PUI. Patient also with new O2 requirement of 2L. Initial COVID rapid test neg, however, given new O2 need and possible high incidence in community it was decided with attending approval to have repeat COVID testing and isolation precautions.  As patient was transferred to another floor for COVID precautions, patient became upset due to having to move to multiple floors and chose to leave AMA. AC spoke to patient but patient left prior to physicians being able to discuss risks/benefits with patients.   Issues for Follow Up:  1. F/u pelvic mass.   Significant Procedures: none  Significant Labs and Imaging:  Recent Labs  Lab 02/13/19 0400  WBC 16.6*  HGB 13.9  HCT 43.9  PLT 371   Recent Labs  Lab 02/13/19 0400  NA 142  K 3.1*  CL 105  CO2 23  GLUCOSE 169*  BUN 5*  CREATININE 0.64  CALCIUM 9.3  ALKPHOS 207*  AST 27  ALT 22  ALBUMIN 4.3    Ref. Range 02/13/2019 11:44  LDH Latest Ref Range: 98 - 192 U/L 209 (H)  Triglycerides Latest Ref Range: <150 mg/dL 51  Ferritin Latest Ref Range: 11 - 307 ng/mL 43  Procalcitonin Latest Units: ng/mL <0.10  D-Dimer, Quant Latest Ref Range: 0.00 - 0.50 ug/mL-FEU 1.13 (H)  Fibrinogen Latest Ref Range: 210 - 475 mg/dL 368    Ct Abdomen Pelvis W Contrast  Result Date: 02/13/2019 CLINICAL DATA:  Abdominal distention. Palpable lower abdominal mass. EXAM: CT ABDOMEN AND PELVIS WITH CONTRAST TECHNIQUE: Multidetector CT imaging of the abdomen and pelvis was performed using the standard protocol following bolus administration of intravenous contrast. CONTRAST:  182mL ISOVUE-300 IOPAMIDOL (ISOVUE-300) INJECTION 61% COMPARISON:  Noncontrast CT on 07/14/2017 FINDINGS: Lower Chest: No acute findings. Hepatobiliary: No hepatic masses identified. Stable tiny left hepatic lobe cyst. High attenuation gallbladder sludge noted, however there is no evidence of cholecystitis or biliary ductal dilatation. Pancreas:  No mass or inflammatory changes. Spleen: Within normal limits in size and appearance. Adrenals/Urinary Tract: No  masses identified. No evidence of hydronephrosis. Stomach/Bowel: No evidence of obstruction, inflammatory process or abnormal fluid collections. Normal appendix visualized.  Vascular/Lymphatic: No pathologically enlarged lymph nodes. No abdominal aortic aneurysm. Aortic atherosclerosis. Reproductive: A few tiny partially calcified uterine fibroids are noted. Increased size of complex cystic lesions seen in the central pelvis and lower abdomen since prior study, currently measuring 20.1 x 11.8 x 17.2 cm, compared to 14.7 x 10.4 by 11.0 cm previously. This lesion shows mild mural wall thickening and nodularity and a thin peripheral septation. This is highly suspicious for low-grade ovarian cystadenocarcinoma. A tiny amount of free fluid is seen in the pelvic cul-de-sac, without significant change. No evidence of peritoneal soft tissue thickening or omental caking. Other:  None. Musculoskeletal:  No suspicious bone lesions identified. IMPRESSION: 1. Increased size of 20 cm complex cystic lesion in central pelvis and lower abdomen, highly suspicious for low-grade ovarian cystadenocarcinoma. 2. Tiny amount of free pelvic fluid, without evidence of peritoneal thickening or omental caking. Electronically Signed   By: Earle Gell M.D.   On: 02/13/2019 08:01   Dg Chest Portable 1 View  Result Date: 02/13/2019 CLINICAL DATA:  Shortness of breath. EXAM: PORTABLE CHEST 1 VIEW COMPARISON:  One-view chest x-ray 04/13/2015 FINDINGS: The heart size and mediastinal contours are within normal limits. Both lungs are clear. The visualized skeletal structures are unremarkable. IMPRESSION: Negative one-view chest x-ray Electronically Signed   By: San Morelle M.D.   On: 02/13/2019 05:03     Results/Tests Pending at Time of Discharge:  Unresulted Labs (From admission, onward)    Start     Ordered   02/20/19 0500  Creatinine, serum  (enoxaparin (LOVENOX)    CrCl >/= 30 ml/min)  Weekly,   R    Comments:  while on enoxaparin therapy    02/13/19 0746   02/13/19 1048  Hepatitis B surface antigen  Once,   R     02/13/19 1048   02/13/19 1048  Legionella Pneumophila Serogp 1 Ur Ag  Once,   R      02/13/19 1048   02/13/19 1048  Strep pneumoniae urinary antigen  Once,   R     02/13/19 1048   02/13/19 1047  Novel Coronavirus, NAA (hospital order; send-out to ref lab)  (Novel Coronavirus, NAA (Iron))  Once,   R    Question:  Current symptoms  Answer:  Other (testing not indicated)   02/13/19 1048   02/13/19 1047  Respiratory Panel by PCR  Add-on,   R     02/13/19 1048   02/13/19 0600  Blood culture (routine x 2)  BLOOD CULTURE X 2,   STAT     02/13/19 0559   02/13/19 0400  Pathologist smear review  Once,   R     02/13/19 0400           Discharge Instructions: Please refer to Patient Instructions section of EMR for full details.  Patient was counseled important signs and symptoms that should prompt return to medical care, changes in medications, dietary instructions, activity restrictions, and follow up appointments.   Follow-Up Appointments:   Caroline More, DO 02/13/2019, 3:40 PM PGY-2, [facility]

## 2019-02-13 NOTE — Progress Notes (Signed)
Patient has audile expiratory wheezes, new vitals taken

## 2019-02-13 NOTE — ED Notes (Signed)
AC notified, bed placement changed.

## 2019-02-13 NOTE — ED Provider Notes (Signed)
Emergency Department Provider Note   I have reviewed the triage vital signs and the nursing notes.   HISTORY  Chief Complaint Respiratory Distress   HPI Tracey Ferrell is a 68 y.o. female history of COPD, hypertension hyperlipidemia the presents the emergency department today with respiratory distress.  Patient states over the last week she been progressively worsening with cough and shortness of breath.  She has not taken her blood pressure and diuretic medication because of all the medicines she has been taking and she did not want them to interact.   No fevers.  Has had some productive cough.  Has some abdominal distention but no lower extremity swelling.  No chest pain.  No fevers.  She stayed home and quarantine recently with no known sick contacts.  No other associated or modifying symptoms.    Past Medical History:  Diagnosis Date   Asthma    Back pain    Borderline diabetic    COPD (chronic obstructive pulmonary disease) (HCC)    Depression    Hyperlipidemia    Hypertension    Shortness of breath dyspnea     Patient Active Problem List   Diagnosis Date Noted   LOC (loss of consciousness) ([place]) 04/14/2015   COPD exacerbation ([place]) 04/13/2015   Acute respiratory failure with hypoxia ([place]) 04/13/2015   Depression 04/13/2015   Fibromyalgia 04/13/2015   Polycythemia 04/13/2015   Bronchitis, acute 04/13/2015   Dyslipidemia 04/13/2015   HTN (hypertension) 04/13/2015    Past Surgical History:  Procedure Laterality Date   COSMETIC SURGERY      Current Outpatient Rx   Order #: [id number] Class: Print   Order #: [id number] Class: Print   Order #: [id number] Class: Print   Order #: [id number] Class: Print   Order #: [id number] Class: Print   Order #: [id number] Class: Historical Med   Order #: [id number] Class: Historical Med   Order #: [id number] Class: Historical Med   Order #: [id number] Class: Print   Order #: [id number] Class: Print   Order #:  [id number] Class: Historical Med   Order #: [id number] Class: Print   Order #: [id number] Class: Print   Order #: [id number] Class: Print    Allergies Patient has no known allergies.  No family history on file.  Social History Social History   Tobacco Use   Smoking status: Former Smoker    Packs/day: 0.50    Years: 44.00    Pack years: 22.00    Types: Cigarettes    Last attempt to quit: 12/30/2014    Years since quitting: 4.1   Smokeless tobacco: Never Used  Substance Use Topics   Alcohol use: No    Comment: former   Drug use: No    Review of Systems  All other systems negative except as documented in the HPI. All pertinent positives and negatives as reviewed in the HPI. ____________________________________________   PHYSICAL EXAM:  VITAL SIGNS: Blood pressure 133/70, pulse 92, temperature 97.6 F (36.4 C), temperature source Oral, resp. rate (!) 23, height 4\' 11"  (1.499 m), weight 49.9 kg, SpO2 96 %.   Constitutional: Alert and oriented. Well appearing and in no acute distress. Eyes: Conjunctivae are normal. PERRL. EOMI. Head: Atraumatic. Nose: No congestion/rhinnorhea. Mouth/Throat: Mucous membranes are moist.  Oropharynx non-erythematous. Neck: No stridor.  No meningeal signs.   Cardiovascular: tachycardic rate, regular rhythm. Good peripheral circulation. Grossly normal heart sounds.   Respiratory: tachypneic respiratory effort.  Can't speak in full sentences. intercostal retractions. Lungs diminished, wheezing and some rhonchi. Gastrointestinal: Soft and nontender. No distention.  Musculoskeletal: No  lower extremity tenderness nor edema. No gross deformities of extremities. Neurologic:  Normal speech and language. No gross focal neurologic deficits are appreciated.  Skin:  Skin is warm, dry and intact. No rash noted.   ____________________________________________   LABS (all labs ordered are listed, but only abnormal results are displayed)  Labs Reviewed   CBC WITH DIFFERENTIAL/PLATELET - Abnormal; Notable for the following components:      Result Value   WBC 16.6 (*)    Neutro Abs 7.8 (*)    Monocytes Absolute 0.0 (*)    Eosinophils Absolute 5.8 (*)    Basophils Absolute 0.2 (*)    All other components within normal limits  COMPREHENSIVE METABOLIC PANEL - Abnormal; Notable for the following components:   Potassium 3.1 (*)    Glucose, Bld 169 (*)    BUN 5 (*)    Alkaline Phosphatase 207 (*)    All other components within normal limits  BRAIN NATRIURETIC PEPTIDE - Abnormal; Notable for the following components:   B Natriuretic Peptide 305.1 (*)    All other components within normal limits  D-DIMER, QUANTITATIVE (NOT AT [facility]) - Abnormal; Notable for the following components:   D-Dimer, Quant 1.13 (*)    All other components within normal limits  LACTATE DEHYDROGENASE - Abnormal; Notable for the following components:   LDH 209 (*)    All other components within normal limits  SARS CORONAVIRUS 2 (HOSPITAL ORDER, [place] [facility])  CULTURE, BLOOD (ROUTINE X 2)  CULTURE, BLOOD (ROUTINE X 2)  NOVEL CORONAVIRUS, NAA (HOSPITAL ORDER, SEND-OUT TO REF LAB)  HIV ANTIBODY (ROUTINE TESTING W REFLEX)  TROPONIN I  FERRITIN  FIBRINOGEN  PROCALCITONIN  TRIGLYCERIDES  PATHOLOGIST SMEAR REVIEW  HEPATITIS B SURFACE ANTIGEN  ABO/RH   ____________________________________________  EKG   EKG Interpretation  Date/Time:  Wednesday February 13 2019 04:05:21 EDT Ventricular Rate:  98 PR Interval:    QRS Duration: 119 QT Interval:  386 QTC Calculation: 493 R Axis:   72 Text Interpretation:  Sinus rhythm LAE, consider biatrial enlargement Nonspecific intraventricular conduction delay Nonspecific T abnormalities, anterior leads No acute changes Confirmed by Merrily Pew [phone]) on 02/13/2019 4:31:36 AM Also confirmed by Merrily Pew [phone]), editor Philomena Doheny [phone])  on 02/13/2019 7:41:23 AM Also confirmed by Merrily Pew  [phone]), editor Philomena Doheny [phone])  on 02/13/2019 7:43:40 AM       ____________________________________________  RADIOLOGY  Ct Abdomen Pelvis W Contrast  Result Date: 02/13/2019 CLINICAL DATA:  Abdominal distention. Palpable lower abdominal mass. EXAM: CT ABDOMEN AND PELVIS WITH CONTRAST TECHNIQUE: Multidetector CT imaging of the abdomen and pelvis was performed using the standard protocol following bolus administration of intravenous contrast. CONTRAST:  164mL ISOVUE-300 IOPAMIDOL (ISOVUE-300) INJECTION 61% COMPARISON:  Noncontrast CT on 07/14/2017 FINDINGS: Lower Chest: No acute findings. Hepatobiliary: No hepatic masses identified. Stable tiny left hepatic lobe cyst. High attenuation gallbladder sludge noted, however there is no evidence of cholecystitis or biliary ductal dilatation. Pancreas:  No mass or inflammatory changes. Spleen: Within normal limits in size and appearance. Adrenals/Urinary Tract: No masses identified. No evidence of hydronephrosis. Stomach/Bowel: No evidence of obstruction, inflammatory process or abnormal fluid collections. Normal appendix visualized. Vascular/Lymphatic: No pathologically enlarged lymph nodes. No abdominal aortic aneurysm. Aortic atherosclerosis. Reproductive: A few tiny partially calcified uterine fibroids are noted. Increased size of complex cystic lesions seen in the central pelvis and lower abdomen since prior study, currently measuring 20.1 x 11.8 x 17.2 cm, compared to 14.7 x 10.4 by  11.0 cm previously. This lesion shows mild mural wall thickening and nodularity and a thin peripheral septation. This is highly suspicious for low-grade ovarian cystadenocarcinoma. A tiny amount of free fluid is seen in the pelvic cul-de-sac, without significant change. No evidence of peritoneal soft tissue thickening or omental caking. Other:  None. Musculoskeletal:  No suspicious bone lesions identified. IMPRESSION: 1. Increased size of 20 cm complex cystic lesion in  central pelvis and lower abdomen, highly suspicious for low-grade ovarian cystadenocarcinoma. 2. Tiny amount of free pelvic fluid, without evidence of peritoneal thickening or omental caking. Electronically Signed   By: Earle Gell M.D.   On: 02/13/2019 08:01   Dg Chest Portable 1 View  Result Date: 02/13/2019 CLINICAL DATA:  Shortness of breath. EXAM: PORTABLE CHEST 1 VIEW COMPARISON:  One-view chest x-ray 04/13/2015 FINDINGS: The heart size and mediastinal contours are within normal limits. Both lungs are clear. The visualized skeletal structures are unremarkable. IMPRESSION: Negative one-view chest x-ray Electronically Signed   By: San Morelle M.D.   On: 02/13/2019 05:03    ____________________________________________   PROCEDURES  Procedure(s) performed:   .Critical Care Performed by: Merrily Pew, MD Authorized by: Merrily Pew, MD   Critical care provider statement:    Critical care time (minutes):  45   Critical care was necessary to treat or prevent imminent or life-threatening deterioration of the following conditions:  Respiratory failure   Critical care was time spent personally by me on the following activities:  Discussions with consultants, evaluation of patient's response to treatment, examination of patient, ordering and performing treatments and interventions, ordering and review of laboratory studies, ordering and review of radiographic studies, pulse oximetry, re-evaluation of patient's condition, obtaining history from patient or surrogate and review of old charts   ____________________________________________   INITIAL IMPRESSION / ASSESSMENT AND PLAN / ED COURSE  Media Pizzini was evaluated in Emergency Department on 02/13/2019 for the symptoms described in the history of present illness. She was evaluated in the context of the global COVID-19 pandemic, which necessitated consideration that the patient might be at risk for infection with the SARS-CoV-2 virus  that causes COVID-19. Institutional protocols and algorithms that pertain to the evaluation of patients at risk for COVID-19 are in a state of rapid change based on information released by regulatory bodies including the CDC and federal and state organizations. These policies and algorithms were followed during the patient's care in the ED.   Likely COPD exacerbation with hypoxic respiratory failure.  COVID negative.  No obvious infiltrates on x-ray.  No evidence for this to be pulmonary edema or ACS related.  Doubt PE without chest pain or other risk factors. Antibiotics given.  Breathing treatments given still requiring oxygen so will be admitted to the hospital.     Pertinent labs & imaging results that were available during my care of the patient were reviewed by me and considered in my medical decision making (see chart for details).     ____________________________________________  FINAL CLINICAL IMPRESSION(S) / ED DIAGNOSES  Final diagnoses:  Respiratory distress  Hypoxia  COPD exacerbation ([place])     MEDICATIONS GIVEN DURING THIS VISIT:  Medications  albuterol (PROVENTIL HFA;VENTOLIN HFA) 108 (90 Base) MCG/ACT inhaler 8 puff (has no administration in time range)     NEW OUTPATIENT MEDICATIONS STARTED DURING THIS VISIT:  New Prescriptions   No medications on file    Note:  This note was prepared with assistance of Dragon voice recognition software. Occasional wrong-word or sound-a-like substitutions  may have occurred due to the inherent limitations of voice recognition software.   Brooklee Michelin, Corene Cornea, MD 02/14/19 [phone]

## 2019-02-13 NOTE — ED Triage Notes (Signed)
Pt has been have trouble breathing for the past 4 days.Marland Kitchen

## 2019-02-13 NOTE — Progress Notes (Signed)
Obtained report from ED 

## 2019-02-13 NOTE — Progress Notes (Signed)
Patient request to leave AMA , MD notified

## 2019-02-13 NOTE — H&P (Signed)
[facility] Admission History and Physical Service Pager: [phone]  Patient name: Tracey Ferrell record number: [id number] Date of birth: December 26, 1950 Age: 68 y.o. Gender: female  Primary Care Provider: Wendie Simmer, MD Consultants: none Code Status: Full  Chief Complaint: Shortness of breath  Assessment and Plan: Tracey Ferrell is a 68 y.o. female presenting with shortness of breath for approximately 1 week. PMH is significant for COPD, hypertension, overactive bladder, pelvic mass on imaging in 2018 which was lost to follow-up  Shortness of breath, likely COPD exacerbation, COVID negative with low pretest suspicion.  Patient said this presentation is much like her prior episodes of COPD exacerbation.  She has been having sensation of wheezing and shortness of breath for approximately 1 week.  She has had a feeling of cough but has been dry.  She has had no fevers.  She has had no known sick contacts and has not been out in public very much since the coronavirus started.  As noted earlier rapid COVID test in the emergency department was negative.  Patient is afebrile and does not have any lymphopenia on exam.  She responded well to duo nebs and was satting at 95% with 2 L during her treatment.  Upon admission interview she said that she is not particularly sure which of her medicines supposed be taken at which times but that she has been taking all of them "a lot ".  Patient was given 1 dose of ceftriaxone and 1 dose of azithromycin in the emergency department -Admit to MedSurg, Dr. Andria Frames -Not a COVID PUI admission, low pretest probability and negative Covid test. -Azithromycin 500 mg to complete a 3-day course ending on 417 -Start prednisone 40 mg x 5 days -O2 supplementation to keep sats between 80 and 92 via nasal cannula -Duo nebs every 4 -Albuterol nebulizer every 2 as needed -Pharmacy will check in with patient for medication education  Pelvic mass:  2018 CT stone tracer showed 11 cm pelvic mass and recommended pelvic ultrasound follow-up.  We have no record of this and patient denies any follow-up was done.  She said "no one called her ".  She is not any belly pain but she said her belly has been getting bigger, she denies any bowel or urinary problems beyond her chronic overactive bladder.  Abdomen is physically distended on exam as she is a very small woman, nonpainful -Repeat CT abdomen  Hypertension: Patient on home amlodipine, she had not been taking it because she thought that it would interfere with her breathing medication.  Presented at 198/83 which could have been erroneous versus temporarily raised during a moment of panic to shortness of breath.  Blood pressures since have been 147W to 295A systolic.  No focal neuro problems on exam and creatinine is within normal limits -Start home amlodipine -Pharmacy will check in with patient for medication education -Monitor BP to see if patient might need additional medication on DC  Overactive bladder: Chronic and stable, patient takes Toviaz.  There is some question as to large pelvic mass impacting this structurally. -Continue home Toviaz -Pelvic imaging as above for mass  Hyperlipidemia: Patient on home atorvastatin will continue, no indication of need to change treatment during this admission  Pain control-patient has home gabapentin nightly 300 PRN. -Continue home PRN gabapentin -Tylenol PRN for pain  FEN/GI: Heart healthy diet Prophylaxis: Lovenox  Disposition: MedSurg until stable on room air and pelvic mass work-up complete then DC to home  History of Present  Illness:  Tracey Ferrell is a 68 y.o. female presenting with 1 week history of shortness of breath.  She denies fever symptoms, she denies any sick contacts, she has not been around any known coronavirus exposures.   She says that this is consistent with prior COPD exacerbations.  There is some confusion with her medication  regimen as she does not know how often when she supposed be taking them and does not know the difference in a controller and an emergency med.  She says she has been taking all of them "a lot ".  She is also stopped taking all of her blood pressure medicine because she was worried that she had too much and that it would interfere with her breathing regimen.  She denies any nausea vomiting, appetite has been reduced this week because she has had trouble breathing but has been stable otherwise.  No bowel symptoms, no urinary symptoms beyond her chronic overactive bladder.  No new rashes or anything.  She denies illicit drug use or potential withdrawal from any substances.  She would like to be full code.  She also request information about her abdomen.  She said that on a prior admission she was told that there was "something in there "but that she said no one called her to follow-up.  She says her belly has been getting bigger since then and while does not hurt she is concerned and wants to make sure everything is okay.  Review Of Systems: Per HPI with the following additions:   Review of Systems  Constitutional: Positive for malaise/fatigue. Negative for chills and fever.  HENT: Positive for congestion. Negative for ear pain, sinus pain and sore throat.   Eyes: Negative for discharge and redness.  Respiratory: Positive for cough, shortness of breath and wheezing. Negative for hemoptysis, sputum production and stridor.   Cardiovascular: Negative for chest pain and leg swelling.  Gastrointestinal: Positive for heartburn. Negative for abdominal pain, blood in stool, constipation, diarrhea, melena, nausea and vomiting.  Genitourinary: Positive for urgency. Negative for dysuria.  Musculoskeletal: Negative for joint pain and myalgias.  Skin: Negative for itching and rash.  Neurological: Negative for dizziness, seizures and loss of consciousness.  Psychiatric/Behavioral: Negative for hallucinations, memory  loss and substance abuse.    Patient Active Problem List   Diagnosis Date Noted  . COPD (chronic obstructive pulmonary disease) ([place]) 02/13/2019  . LOC (loss of consciousness) ([place]) 04/14/2015  . COPD exacerbation ([place]) 04/13/2015  . Acute respiratory failure with hypoxia ([place]) 04/13/2015  . Depression 04/13/2015  . Fibromyalgia 04/13/2015  . Polycythemia 04/13/2015  . Bronchitis, acute 04/13/2015  . Dyslipidemia 04/13/2015  . HTN (hypertension) 04/13/2015    Past Medical History: Past Medical History:  Diagnosis Date  . Asthma   . Back pain   . Borderline diabetic   . COPD (chronic obstructive pulmonary disease) ([place])   . Depression   . Hyperlipidemia   . Hypertension   . Shortness of breath dyspnea     Past Surgical History: Past Surgical History:  Procedure Laterality Date  . COSMETIC SURGERY      Social History: Social History   Tobacco Use  . Smoking status: Former Smoker    Packs/day: 0.50    Years: 44.00    Pack years: 22.00    Types: Cigarettes    Last attempt to quit: 12/30/2014    Years since quitting: 4.1  . Smokeless tobacco: Never Used  Substance Use Topics  . Alcohol use: No  Comment: former  . Drug use: No   Additional social history:  Please also refer to relevant sections of EMR.  Family History: No family history on file. Lives with family  Allergies and Medications: No Known Allergies No current facility-administered medications on file prior to encounter.    Current Outpatient Medications on File Prior to Encounter  Medication Sig Dispense Refill  . albuterol (PROVENTIL HFA;VENTOLIN HFA) 108 (90 BASE) MCG/ACT inhaler Inhale 2 puffs into the lungs every 4 (four) hours as needed for wheezing or shortness of breath. 1 Inhaler 5  . amLODipine (NORVASC) 5 MG tablet Take 1 tablet (5 mg total) by mouth daily. 30 tablet 3  . ANORO ELLIPTA 62.5-25 MCG/INH AEPB Inhale 1 puff into the lungs daily.    Marland Kitchen atorvastatin (LIPITOR) 20 MG tablet  Take 20 mg by mouth daily.    . cholecalciferol (VITAMIN D3) 25 MCG (1000 UT) tablet Take 1,000 Units by mouth daily.    Marland Kitchen gabapentin (NEURONTIN) 300 MG capsule Take 300 mg by mouth at bedtime as needed for pain.    Marland Kitchen ibuprofen (ADVIL,MOTRIN) 200 MG tablet Take 600 mg by mouth every 6 (six) hours as needed for moderate pain.    Marland Kitchen omeprazole (PRILOSEC) 20 MG capsule Take 20 mg by mouth daily.    . TOVIAZ 4 MG TB24 tablet Take 4 mg by mouth daily.    Marland Kitchen azithromycin (ZITHROMAX) 500 MG tablet Take 1 tablet (500 mg total) by mouth daily. X 1 week (Patient not taking: Reported on 02/13/2019) 7 tablet 0  . budesonide-formoterol (SYMBICORT) 80-4.5 MCG/ACT inhaler Inhale 2 puffs into the lungs 2 (two) times daily. (Patient not taking: Reported on 02/13/2019) 1 Inhaler 12  . cephALEXin (KEFLEX) 500 MG capsule Take 1 capsule (500 mg total) by mouth 3 (three) times daily. (Patient not taking: Reported on 02/13/2019) 21 capsule 0  . guaifenesin (ROBITUSSIN) 100 MG/5ML syrup Take 10 mLs (200 mg total) by mouth every 4 (four) hours as needed for congestion. (Patient not taking: Reported on 02/13/2019) 120 mL 0  . HYDROcodone-acetaminophen (NORCO/VICODIN) 5-325 MG tablet Take 1 tablet by mouth every 6 (six) hours as needed for severe pain. (Patient not taking: Reported on 02/13/2019) 5 tablet 0  . pravastatin (PRAVACHOL) 40 MG tablet Take 1 tablet (40 mg total) by mouth daily. (Patient not taking: Reported on 02/13/2019) 30 tablet 4  . predniSONE (DELTASONE) 10 MG tablet Prednisone dosing: Take  Prednisone 40mg  (4 tabs) x 3 days, then taper to 30mg  (3 tabs) x 3 days, then 20mg  (2 tabs) x 3days, then 10mg  (1 tab) x 3days, then OFF.  Dispense:  30 tabs, refills: None (Patient not taking: Reported on 02/13/2019) 30 tablet 0  . tiotropium (SPIRIVA) 18 MCG inhalation capsule Place 1 capsule (18 mcg total) into inhaler and inhale daily. (Patient not taking: Reported on 02/13/2019) 30 capsule 12    Objective: BP (!) 145/73 (BP  Location: Right Arm)   Pulse 88   Temp 98.9 F (37.2 C) (Oral)   Resp (!) 27   Ht 4\' 11"  (1.499 m)   Wt 49.9 kg   SpO2 98%   BMI 22.22 kg/m  Exam: General: Nontoxic-appearing, pleasant, on DuoNeb and nasal cannula Eyes: Clear sclera with no drainage ENTM: No rhinorrhea, no epistaxis, on DuoNeb and nasal cannula satting at 95% Neck: Soft Cardiovascular: Regular rate and rhythm, no murmur identified but auscultation was inhibited by DuoNeb and respiratory sounds Respiratory: Diffuse wheezing but with movement throughout.  Rhonchorous sounds  as well, no increased work of breathing while on DuoNeb and 2 L nasal cannula, was dry coughing during exam Gastrointestinal: Distended but not painful belly on exam, was firm and concerning giving patient's story of pelvic mass.  No rebound tenderness, not a "acute belly "to my exam MSK: No deficits noted, was able to move around better well Derm: No significant lesions or rashes to exposed skin Neuro: No focal deficits noted Psych: Pleasant patient, a/ox3, processing information appropriately but with low medical literacy  Labs and Imaging: CBC BMET  Recent Labs  Lab 02/13/19 0400  WBC 16.6*  HGB 13.9  HCT 43.9  PLT 371   Recent Labs  Lab 02/13/19 0400  NA 142  K 3.1*  CL 105  CO2 23  BUN 5*  CREATININE 0.64  GLUCOSE 169*  CALCIUM 9.3     Dg Chest Portable 1 View  Result Date: 02/13/2019 CLINICAL DATA:  Shortness of breath. EXAM: PORTABLE CHEST 1 VIEW COMPARISON:  One-view chest x-ray 04/13/2015 FINDINGS: The heart size and mediastinal contours are within normal limits. Both lungs are clear. The visualized skeletal structures are unremarkable. IMPRESSION: Negative one-view chest x-ray Electronically Signed   By: San Morelle M.D.   On: 02/13/2019 05:03    Sherene Sires, DO 02/13/2019, 7:47 AM PGY-2, [place] Intern pager: [phone], text pages welcome

## 2019-02-13 NOTE — TOC Initial Note (Signed)
Transition of Care [facility]) - Initial/Assessment Note    Patient Details  Name: Tracey Ferrell MRN: [id number] Date of Birth: [birth year]  Transition of Care [facility]) CM/SW Contact:    Pollie Friar, RN Phone Number: 02/13/2019, 11:36 AM  Clinical Narrative:                 Plan is to transfer pt to [facility].  Expected Discharge Plan: Home/Self Care Barriers to Discharge: Continued Medical Work up   Patient Goals and CMS Choice        Expected Discharge Plan and Services Expected Discharge Plan: Home/Self Care       Living arrangements for the past 2 months: Apartment(ground level)                          Prior Living Arrangements/Services Living arrangements for the past 2 months: Apartment(ground level) Lives with:: Self Patient language and need for interpreter reviewed:: Yes(does speak some english) Do you feel safe going back to the place where you live?: Yes        Care giver support system in place?: No (comment)(Has a friend: Pearson Forster that checks on her)   Criminal Activity/Legal Involvement Pertinent to Current Situation/Hospitalization: No - Comment as needed  Activities of Daily Living      Permission Sought/Granted                  Emotional Assessment Appearance:: Appears stated age Attitude/Demeanor/Rapport: Engaged Affect (typically observed): Pleasant, Accepting, Appropriate Orientation: : Oriented to Self, Oriented to Place, Oriented to  Time, Oriented to Situation   Psych Involvement: No (comment)  Admission diagnosis:  Respiratory distress [R06.03] Hypoxia [R09.02] COPD exacerbation ([place]) [J44.1] Patient Active Problem List   Diagnosis Date Noted  . COPD (chronic obstructive pulmonary disease) ([place]) 02/13/2019  . LOC (loss of consciousness) ([place]) 04/14/2015  . COPD exacerbation ([place]) 04/13/2015  . Acute respiratory failure with hypoxia ([place]) 04/13/2015  . Depression 04/13/2015  . Fibromyalgia 04/13/2015  .  Polycythemia 04/13/2015  . Bronchitis, acute 04/13/2015  . Dyslipidemia 04/13/2015  . HTN (hypertension) 04/13/2015   PCP:  Wendie Simmer, MD Pharmacy:   [id number] - [place], [place]. AT [address] [place] [address] Phone: [phone] Fax: [phone]     Social Determinants of Health (SDOH) Interventions  Pt denies issues with obtaining or taking home meds.  Pt uses the bus for transportation.  Readmission Risk Interventions No flowsheet data found.

## 2019-02-13 NOTE — Progress Notes (Signed)
Iv removed, MD notified of patient leaving AMA, Patient stated she would ride bus home

## 2019-02-13 NOTE — ED Notes (Signed)
ED TO INPATIENT HANDOFF REPORT  ED Nurse Name and Phone #:  Rodney Langton [phone]  S Name/Age/Gender Tracey Ferrell 68 y.o. female Room/Bed: 016C/016C  Code Status   Code Status: Prior  Home/SNF/Other Nursing Home Patient oriented to: self, place, time and situation Is this baseline? Yes   Triage Complete: Triage complete  Chief Complaint sob  Triage Note Pt has been have trouble breathing for the past 4 days..   Allergies No Known Allergies  Level of Care/Admitting Diagnosis ED Disposition    ED Disposition Condition Comment   Admit  Hospital Area: [facility] [[id number]]  Level of Care: Med-Surg [16]  Diagnosis: COPD (chronic obstructive pulmonary disease) [facility]) [[id number]]  Admitting Physician: Sherene Sires [[id number]]  Attending Physician: Madison Hickman A [[id number]]  Estimated length of stay: past midnight tomorrow  Certification:: I certify this patient will need inpatient services for at least 2 midnights  Possible Covid Disease Patient Isolation: Low Risk  (Less than 4L [place] supplementation)  PT Class (Do Not Modify): Inpatient [101]  PT Acc Code (Do Not Modify): Private [1]       B Medical/Surgery History Past Medical History:  Diagnosis Date  . Asthma   . Back pain   . Borderline diabetic   . COPD (chronic obstructive pulmonary disease) ([place])   . Depression   . Hyperlipidemia   . Hypertension   . Shortness of breath dyspnea    Past Surgical History:  Procedure Laterality Date  . COSMETIC SURGERY       A IV Location/Drains/Wounds Patient Lines/Drains/Airways Status   Active Line/Drains/Airways    Name:   Placement date:   Placement time:   Site:   Days:   Peripheral IV Left Forearm   -    0350    Forearm             Intake/Output Last 24 hours No intake or output data in the 24 hours ending 02/13/19 0630  Labs/Imaging Results for orders placed or performed during the hospital encounter of 02/13/19 (from the past 48 hour(s))   Troponin I - Once     Status: None   Collection Time: 02/13/19  4:00 AM  Result Value Ref Range   Troponin I <0.03 <0.03 ng/mL    Comment: Performed at [facility], [place] [address] [place], [place] [address]  CBC with Differential/Platelet     Status: Abnormal   Collection Time: 02/13/19  4:00 AM  Result Value Ref Range   WBC 16.6 (H) 4.0 - 10.5 K/uL    Comment: REPEATED TO VERIFY   RBC 4.81 3.87 - 5.11 MIL/uL   Hemoglobin 13.9 12.0 - 15.0 g/dL   HCT 43.9 36.0 - 46.0 %   MCV 91.3 80.0 - 100.0 fL   MCH 28.9 26.0 - 34.0 pg   MCHC 31.7 30.0 - 36.0 g/dL   RDW 13.2 11.5 - 15.5 %   Platelets 371 150 - 400 K/uL   nRBC 0.0 0.0 - 0.2 %   Neutrophils Relative % 47 %   Neutro Abs 7.8 (H) 1.7 - 7.7 K/uL   Lymphocytes Relative 17 %   Lymphs Abs 2.8 0.7 - 4.0 K/uL   Monocytes Relative 0 %   Monocytes Absolute 0.0 (L) 0.1 - 1.0 K/uL   Eosinophils Relative 35 %   Eosinophils Absolute 5.8 (H) 0.0 - 0.5 K/uL   Basophils Relative 1 %   Basophils Absolute 0.2 (H) 0.0 - 0.1 K/uL   nRBC 0 0 /  100 WBC   Abs Immature Granulocytes 0.00 0.00 - 0.07 K/uL    Comment: Performed at [facility], [place] [address] [place], [place] [address]  Comprehensive metabolic panel     Status: Abnormal   Collection Time: 02/13/19  4:00 AM  Result Value Ref Range   Sodium 142 135 - 145 mmol/L   Potassium 3.1 (L) 3.5 - 5.1 mmol/L   Chloride 105 98 - 111 mmol/L   CO2 23 22 - 32 mmol/L   Glucose, Bld 169 (H) 70 - 99 mg/dL   BUN 5 (L) 8 - 23 mg/dL   Creatinine, Ser 0.64 0.44 - 1.00 mg/dL   Calcium 9.3 8.9 - 10.3 mg/dL   Total Protein 8.0 6.5 - 8.1 g/dL   Albumin 4.3 3.5 - 5.0 g/dL   AST 27 15 - 41 U/L   ALT 22 0 - 44 U/L   Alkaline Phosphatase 207 (H) 38 - 126 U/L   Total Bilirubin 0.6 0.3 - 1.2 mg/dL   GFR calc non Af Amer >60 >60 mL/min   GFR calc Af Amer >60 >60 mL/min   Anion gap 14 5 - 15    Comment: Performed at [facility], [place] [address], [place], [place] [address]  Brain natriuretic  peptide     Status: Abnormal   Collection Time: 02/13/19  4:00 AM  Result Value Ref Range   B Natriuretic Peptide 305.1 (H) 0.0 - 100.0 pg/mL    Comment: Performed at [facility] [address], [place], [place] [address]  SARS Coronavirus 2 [facility] order, Performed in [facility] hospital lab)     Status: None   Collection Time: 02/13/19  4:02 AM  Result Value Ref Range   SARS Coronavirus 2 NEGATIVE NEGATIVE    Comment: (NOTE) If result is NEGATIVE SARS-CoV-2 target nucleic acids are NOT DETECTED. The SARS-CoV-2 RNA is generally detectable in upper and lower  respiratory specimens during the acute phase of infection. The lowest  concentration of SARS-CoV-2 viral copies this assay can detect is 250  copies / mL. A negative result does not preclude SARS-CoV-2 infection  and should not be used as the sole basis for treatment or other  patient management decisions.  A negative result may occur with  improper specimen collection / handling, submission of specimen other  than nasopharyngeal swab, presence of viral mutation(s) within the  areas targeted by this assay, and inadequate number of viral copies  (<250 copies / mL). A negative result must be combined with clinical  observations, patient history, and epidemiological information. If result is POSITIVE SARS-CoV-2 target nucleic acids are DETECTED. The SARS-CoV-2 RNA is generally detectable in upper and lower  respiratory specimens dur ing the acute phase of infection.  Positive  results are indicative of active infection with SARS-CoV-2.  Clinical  correlation with patient history and other diagnostic information is  necessary to determine patient infection status.  Positive results do  not rule out bacterial infection or co-infection with other viruses. If result is PRESUMPTIVE POSTIVE SARS-CoV-2 nucleic acids MAY BE PRESENT.   A presumptive positive result was obtained on the submitted specimen  and confirmed on repeat  testing.  While 2019 novel coronavirus  (SARS-CoV-2) nucleic acids may be present in the submitted sample  additional confirmatory testing may be necessary for epidemiological  and / or clinical management purposes  to differentiate between  SARS-CoV-2 and other Sarbecovirus currently known to infect humans.  If clinically indicated  additional testing with an alternate test  methodology [phone]) is advised. The SARS-CoV-2 RNA is generally  detectable in upper and lower respiratory sp ecimens during the acute  phase of infection. The expected result is Negative. Fact Sheet for Patients:  StrictlyIdeas.no Fact Sheet for Healthcare Providers: BankingDealers.co.za This test is not yet approved or cleared by the [place] FDA and has been authorized for detection and/or diagnosis of SARS-CoV-2 by FDA under an Emergency Use Authorization (EUA).  This EUA will remain in effect (meaning this test can be used) for the duration of the COVID-19 declaration under Section 564(b)(1) of the Act, 21 U.S.C. section 360bbb-3(b)(1), unless the authorization is terminated or revoked sooner. Performed at [facility], [place] [address] [place], [place] [address]   ABO/Rh     Status: None   Collection Time: 02/13/19  4:04 AM  Result Value Ref Range   ABO/RH(D)      A POS Performed at [facility] [address] [place], [place] [address]    Dg Chest Portable 1 View  Result Date: 02/13/2019 CLINICAL DATA:  Shortness of breath. EXAM: PORTABLE CHEST 1 VIEW COMPARISON:  One-view chest x-ray 04/13/2015 FINDINGS: The heart size and mediastinal contours are within normal limits. Both lungs are clear. The visualized skeletal structures are unremarkable. IMPRESSION: Negative one-view chest x-ray Electronically Signed   By: San Morelle M.D.   On: 02/13/2019 05:03    Pending Labs Unresulted Labs (From admission, onward)    Start      Ordered   02/13/19 0600  Blood culture (routine x 2)  BLOOD CULTURE X 2,   STAT     02/13/19 0559   02/13/19 0354  HIV antibody (Routine Testing)  Once,   R     02/13/19 0354          Vitals/Pain Today's Vitals   02/13/19 0530 02/13/19 0545 02/13/19 0600 02/13/19 0615  BP: (!) 160/89 (!) 169/92 (!) 163/89 (!) 163/79  Pulse: 96 (!) 111 97 95  Resp: (!) 29 (!) 30 (!) 28 (!) 27  Temp:      TempSrc:      SpO2: 90% 95% 95% 92%  Weight:      Height:      PainSc:        Isolation Precautions No active isolations  Medications Medications  cefTRIAXone (ROCEPHIN) 1 g in sodium chloride 0.9 % 100 mL IVPB (1 g Intravenous New Bag/Given 02/13/19 0603)  azithromycin (ZITHROMAX) 500 mg in sodium chloride 0.9 % 250 mL IVPB (has no administration in time range)  ipratropium-albuterol (DUONEB) 0.5-2.5 (3) MG/3ML nebulizer solution 3 mL (3 mLs Nebulization Given 02/13/19 0604)  albuterol (PROVENTIL HFA;VENTOLIN HFA) 108 (90 Base) MCG/ACT inhaler 8 puff (8 puffs Inhalation Given 02/13/19 0409)    Mobility walks Low fall risk   Focused Assessments Pulmonary Assessment Handoff:  Lung sounds: Bilateral Breath Sounds: Rhonchi L Breath Sounds: Rhonchi R Breath Sounds: Rhonchi O2 Device: Room Air O2 Flow Rate (L/min): 2 L/min      R Recommendations: See Admitting Provider Note  Report given to:   Additional Notes:

## 2019-02-14 ENCOUNTER — Other Ambulatory Visit: Payer: Self-pay | Admitting: Family Medicine

## 2019-02-14 DIAGNOSIS — C801 Malignant (primary) neoplasm, unspecified: Secondary | ICD-10-CM

## 2019-02-14 LAB — PATHOLOGIST SMEAR REVIEW: Path Review: INCREASED

## 2019-02-14 LAB — HEPATITIS B SURFACE ANTIGEN: Hepatitis B Surface Ag: NEGATIVE

## 2019-02-14 NOTE — Progress Notes (Signed)
Patient presented to emergency department [facility] February 13, 2019 with symptoms concerning for COPD exacerbation.  At that point she asked about a prior finding for her abdomen which she says is beginning bigger.  Chart review showed a approximately 11 cm pelvic mass on CT in 2018.  Patient was lost to follow-up.  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  Due to conflict with staff over reassigning of room, patient left AMA before we were able to have significant conversation with her about the findings of the CT and her need for follow-up.  I attempted to call her phone listed in demographics to make sure she was aware and it was no longer active.  I called her PCP listed in care team's and was told by Ms. Robberson's office that the patient was no longer following with her.  Out of concern this patient would be lost to follow-up again I went by the address listed in Ms. Goodbar was there.  I informed her that the mass in her belly had grown bigger like she thought, that we believe that it was potentially cancer.  And that she needed to follow-up with the cancer doctor.  I reconfirmed the phone number that she has, confirm that she has voicemail set up, and that in the chance that we cannot reach her she has family friends with [employer] Pearson Forster who is also listed in demographics.  I told her that I would change myself to her PCP in the computer and that if she did not hear from someone within a few weeks that she needed to be calling the family medicine residency.  Urgent referral placed to gynecologic oncology with instructions that patient is limited health and English literacy and no transportation.  If clinic is unable to reach her, they have been requested to let me know so that I can try to track her down.  -Dr Criss Rosales

## 2019-02-18 LAB — CULTURE, BLOOD (ROUTINE X 2)
Culture: NO GROWTH
Culture: NO GROWTH
Special Requests: ADEQUATE
Special Requests: ADEQUATE

## 2019-02-20 ENCOUNTER — Inpatient Hospital Stay (HOSPITAL_COMMUNITY)
Admission: EM | Admit: 2019-02-20 | Discharge: 2019-02-25 | DRG: 190 | Disposition: A | Discharge status: Home Health | Payer: Medicare Other | Attending: Family Medicine | Admitting: Family Medicine

## 2019-02-20 ENCOUNTER — Encounter (HOSPITAL_COMMUNITY): Payer: Self-pay | Admitting: Emergency Medicine

## 2019-02-20 ENCOUNTER — Emergency Department (HOSPITAL_COMMUNITY): Payer: Medicare Other

## 2019-02-20 ENCOUNTER — Other Ambulatory Visit: Payer: Self-pay

## 2019-02-20 DIAGNOSIS — C569 Malignant neoplasm of unspecified ovary: Secondary | ICD-10-CM | POA: Diagnosis present

## 2019-02-20 DIAGNOSIS — J441 Chronic obstructive pulmonary disease with (acute) exacerbation: Secondary | ICD-10-CM | POA: Diagnosis present

## 2019-02-20 DIAGNOSIS — Z79899 Other long term (current) drug therapy: Secondary | ICD-10-CM

## 2019-02-20 DIAGNOSIS — J439 Emphysema, unspecified: Principal | ICD-10-CM | POA: Diagnosis present

## 2019-02-20 DIAGNOSIS — E785 Hyperlipidemia, unspecified: Secondary | ICD-10-CM | POA: Diagnosis present

## 2019-02-20 DIAGNOSIS — J449 Chronic obstructive pulmonary disease, unspecified: Secondary | ICD-10-CM | POA: Diagnosis present

## 2019-02-20 DIAGNOSIS — F419 Anxiety disorder, unspecified: Secondary | ICD-10-CM | POA: Diagnosis present

## 2019-02-20 DIAGNOSIS — M797 Fibromyalgia: Secondary | ICD-10-CM | POA: Diagnosis present

## 2019-02-20 DIAGNOSIS — Z87891 Personal history of nicotine dependence: Secondary | ICD-10-CM

## 2019-02-20 DIAGNOSIS — Z9981 Dependence on supplemental oxygen: Secondary | ICD-10-CM

## 2019-02-20 DIAGNOSIS — N83299 Other ovarian cyst, unspecified side: Secondary | ICD-10-CM | POA: Diagnosis present

## 2019-02-20 DIAGNOSIS — Z7951 Long term (current) use of inhaled steroids: Secondary | ICD-10-CM

## 2019-02-20 DIAGNOSIS — I119 Hypertensive heart disease without heart failure: Secondary | ICD-10-CM | POA: Diagnosis present

## 2019-02-20 DIAGNOSIS — R0602 Shortness of breath: Secondary | ICD-10-CM | POA: Diagnosis not present

## 2019-02-20 DIAGNOSIS — N3281 Overactive bladder: Secondary | ICD-10-CM | POA: Diagnosis present

## 2019-02-20 DIAGNOSIS — Z20828 Contact with and (suspected) exposure to other viral communicable diseases: Secondary | ICD-10-CM | POA: Diagnosis present

## 2019-02-20 DIAGNOSIS — D751 Secondary polycythemia: Secondary | ICD-10-CM | POA: Diagnosis present

## 2019-02-20 DIAGNOSIS — J9621 Acute and chronic respiratory failure with hypoxia: Secondary | ICD-10-CM | POA: Diagnosis present

## 2019-02-20 HISTORY — DX: Acute respiratory failure with hypoxia: J96.01

## 2019-02-20 HISTORY — DX: Acute bronchitis, unspecified: J20.9

## 2019-02-20 HISTORY — DX: Secondary polycythemia: D75.1

## 2019-02-20 HISTORY — DX: Unspecified coma: R40.20

## 2019-02-20 LAB — CBC WITH DIFFERENTIAL/PLATELET
Abs Immature Granulocytes: 0 10*3/uL (ref 0.00–0.07)
Basophils Absolute: 0 10*3/uL (ref 0.0–0.1)
Basophils Relative: 0 %
Eosinophils Absolute: 3.4 10*3/uL — ABNORMAL HIGH (ref 0.0–0.5)
Eosinophils Relative: 28 %
HCT: 46.5 % — ABNORMAL HIGH (ref 36.0–46.0)
Hemoglobin: 14.8 g/dL (ref 12.0–15.0)
Lymphocytes Relative: 34 %
Lymphs Abs: 4.1 10*3/uL — ABNORMAL HIGH (ref 0.7–4.0)
MCH: 29.5 pg (ref 26.0–34.0)
MCHC: 31.8 g/dL (ref 30.0–36.0)
MCV: 92.6 fL (ref 80.0–100.0)
Monocytes Absolute: 0.4 10*3/uL (ref 0.1–1.0)
Monocytes Relative: 3 %
Neutro Abs: 4.2 10*3/uL (ref 1.7–7.7)
Neutrophils Relative %: 35 %
Platelets: 489 10*3/uL — ABNORMAL HIGH (ref 150–400)
RBC: 5.02 MIL/uL (ref 3.87–5.11)
RDW: 13.6 % (ref 11.5–15.5)
WBC: 12 10*3/uL — ABNORMAL HIGH (ref 4.0–10.5)
nRBC: 0 % (ref 0.0–0.2)
nRBC: 0 /100 WBC

## 2019-02-20 LAB — COMPREHENSIVE METABOLIC PANEL
ALT: 20 U/L (ref 0–44)
AST: 22 U/L (ref 15–41)
Albumin: 4.2 g/dL (ref 3.5–5.0)
Alkaline Phosphatase: 207 U/L — ABNORMAL HIGH (ref 38–126)
Anion gap: 13 (ref 5–15)
BUN: 5 mg/dL — ABNORMAL LOW (ref 8–23)
CO2: 26 mmol/L (ref 22–32)
Calcium: 9.7 mg/dL (ref 8.9–10.3)
Chloride: 101 mmol/L (ref 98–111)
Creatinine, Ser: 0.71 mg/dL (ref 0.44–1.00)
GFR calc Af Amer: >60 mL/min (ref >60)
GFR calc non Af Amer: >60 mL/min (ref >60)
Glucose, Bld: 119 mg/dL — ABNORMAL HIGH (ref 70–99)
Potassium: 3.5 mmol/L (ref 3.5–5.1)
Sodium: 140 mmol/L (ref 135–145)
Total Bilirubin: 0.7 mg/dL (ref 0.3–1.2)
Total Protein: 8 g/dL (ref 6.5–8.1)

## 2019-02-20 LAB — BRAIN NATRIURETIC PEPTIDE: B Natriuretic Peptide: 79.8 pg/mL (ref 0.0–100.0)

## 2019-02-20 LAB — TROPONIN I: Troponin I: <0.03 ng/mL (ref <0.03)

## 2019-02-20 MED ORDER — IOHEXOL 350 MG/ML SOLN
75.0000 mL | Freq: Once | INTRAVENOUS | Status: AC | PRN
  Administered 2019-02-20: 21:00:00 75 mL via INTRAVENOUS

## 2019-02-20 MED ORDER — ALBUTEROL SULFATE HFA 108 (90 BASE) MCG/ACT IN AERS
2.0000 | INHALATION_SPRAY | Freq: Once | RESPIRATORY_TRACT | Status: AC
  Administered 2019-02-20: 21:00:00 2 via RESPIRATORY_TRACT
  Filled 2019-02-20: qty 6.7

## 2019-02-20 MED ORDER — METHYLPREDNISOLONE SODIUM SUCC 125 MG IJ SOLR
125.0000 mg | Freq: Once | INTRAMUSCULAR | Status: AC
  Administered 2019-02-20: 125 mg via INTRAVENOUS
  Filled 2019-02-20: qty 2

## 2019-02-20 NOTE — ED Triage Notes (Signed)
Patient from home, with shortness of breath for the last few weeks.  She was seen a few days ago for the same.  She was found with sats in the low 80's.  Patient placed on NRB and feeling better with sats in the high 90's.  She was tested for [facility] and flu on 4/15 and was negative.  Congested cough.

## 2019-02-20 NOTE — ED Provider Notes (Signed)
[facility] EMERGENCY DEPARTMENT Provider Note   CSN: [id number] Arrival date & time: 02/20/19  1943    History   Chief Complaint Chief Complaint  Patient presents with   Shortness of Breath    HPI Tracey Ferrell is a 68 y.o. female.     HPI Patient presents for the second time in 1 week with ongoing concern for dyspnea.  He notes that since that evaluation, in spite of using home medication, including albuterol, she has worsening dyspnea. No new fever, no vomiting, no specific pain, no confusion, disorientation. With progression of her dyspnea she presents for evaluation.  Past Medical History:  Diagnosis Date   Asthma    Back pain    Borderline diabetic    COPD (chronic obstructive pulmonary disease) (HCC)    Depression    Hyperlipidemia    Hypertension    Shortness of breath dyspnea     Patient Active Problem List   Diagnosis Date Noted   COPD (chronic obstructive pulmonary disease) ([place]) 02/13/2019   LOC (loss of consciousness) ([place]) 04/14/2015   COPD exacerbation ([place]) 04/13/2015   Acute respiratory failure with hypoxia ([place]) 04/13/2015   Depression 04/13/2015   Fibromyalgia 04/13/2015   Polycythemia 04/13/2015   Bronchitis, acute 04/13/2015   Dyslipidemia 04/13/2015   HTN (hypertension) 04/13/2015    Past Surgical History:  Procedure Laterality Date   COSMETIC SURGERY       OB History   No obstetric history on file.      Home Medications    Prior to Admission medications   Medication Sig Start Date End Date Taking? Authorizing Provider  albuterol (PROVENTIL HFA;VENTOLIN HFA) 108 (90 BASE) MCG/ACT inhaler Inhale 2 puffs into the lungs every 4 (four) hours as needed for wheezing or shortness of breath. 04/15/15   Rai, Ripudeep K, MD  amLODipine (NORVASC) 5 MG tablet Take 1 tablet (5 mg total) by mouth daily. 04/15/15   Rai, Ripudeep K, MD  ANORO ELLIPTA 62.5-25 MCG/INH AEPB Inhale 1 puff into the lungs daily.  10/22/18   [provider]  atorvastatin (LIPITOR) 20 MG tablet Take 20 mg by mouth daily. 02/03/19   [provider]  azithromycin (ZITHROMAX) 500 MG tablet Take 1 tablet (500 mg total) by mouth daily. X 1 week Patient not taking: Reported on 02/13/2019 04/16/15   Rai, Vernelle Emerald, MD  budesonide-formoterol (SYMBICORT) 80-4.5 MCG/ACT inhaler Inhale 2 puffs into the lungs 2 (two) times daily. Patient not taking: Reported on 02/13/2019 04/15/15   Rai, Vernelle Emerald, MD  cephALEXin (KEFLEX) 500 MG capsule Take 1 capsule (500 mg total) by mouth 3 (three) times daily. Patient not taking: Reported on 02/13/2019 07/14/17   Ripley Fraise, MD  cholecalciferol (VITAMIN D3) 25 MCG (1000 UT) tablet Take 1,000 Units by mouth daily.    [provider]  gabapentin (NEURONTIN) 300 MG capsule Take 300 mg by mouth at bedtime as needed for pain. 10/22/18   [provider]  guaifenesin (ROBITUSSIN) 100 MG/5ML syrup Take 10 mLs (200 mg total) by mouth every 4 (four) hours as needed for congestion. Patient not taking: Reported on 02/13/2019 04/15/15   Rai, Vernelle Emerald, MD  HYDROcodone-acetaminophen (NORCO/VICODIN) 5-325 MG tablet Take 1 tablet by mouth every 6 (six) hours as needed for severe pain. Patient not taking: Reported on 02/13/2019 07/14/17   Ripley Fraise, MD  ibuprofen (ADVIL,MOTRIN) 200 MG tablet Take 600 mg by mouth every 6 (six) hours as needed for moderate pain.    [provider]  omeprazole (PRILOSEC) 20 MG capsule Take 20 mg by mouth daily.    [provider]  pravastatin (PRAVACHOL) 40 MG tablet Take 1 tablet (40 mg total) by mouth daily. Patient not taking: Reported on 02/13/2019 04/15/15   Rai, Vernelle Emerald, MD  predniSONE (DELTASONE) 10 MG tablet Prednisone dosing: Take  Prednisone 40mg  (4 tabs) x 3 days, then taper to 30mg  (3 tabs) x 3 days, then 20mg  (2 tabs) x 3days, then 10mg  (1 tab) x 3days, then OFF.  Dispense:  30 tabs, refills: None Patient not  taking: Reported on 02/13/2019 04/16/15   Rai, Vernelle Emerald, MD  tiotropium (SPIRIVA) 18 MCG inhalation capsule Place 1 capsule (18 mcg total) into inhaler and inhale daily. Patient not taking: Reported on 02/13/2019 04/15/15   Rai, Vernelle Emerald, MD  TOVIAZ 4 MG TB24 tablet Take 4 mg by mouth daily. 10/22/18   [provider]    Family History No family history on file.  Social History Social History   Tobacco Use   Smoking status: Former Smoker    Packs/day: 0.50    Years: 44.00    Pack years: 22.00    Types: Cigarettes    Last attempt to quit: 12/30/2014    Years since quitting: 4.1   Smokeless tobacco: Never Used  Substance Use Topics   Alcohol use: No    Comment: former   Drug use: No     Allergies   Patient has no known allergies.   Review of Systems Review of Systems  Constitutional:       Per HPI, otherwise negative  HENT:       Per HPI, otherwise negative  Respiratory:       Per HPI, otherwise negative  Cardiovascular:       Per HPI, otherwise negative  Gastrointestinal: Negative for vomiting.  Endocrine:       Negative aside from HPI  Genitourinary:       Neg aside from HPI   Musculoskeletal:       Per HPI, otherwise negative  Skin: Negative.   Neurological: Negative for syncope.     Physical Exam Updated Vital Signs BP (!) 137/91    Pulse 86    Temp 97.8 F (36.6 C) (Oral)    Resp (!) 21    SpO2 100%   Physical Exam Vitals signs and nursing note reviewed.  Constitutional:      Appearance: She is ill-appearing and diaphoretic.     Comments: Uncomfortable appearing adult female awake and alert  HENT:     Head: Normocephalic and atraumatic.  Eyes:     Conjunctiva/sclera: Conjunctivae normal.  Cardiovascular:     Rate and Rhythm: Regular rhythm. Tachycardia present.  Pulmonary:     Effort: Tachypnea and accessory muscle usage present.     Breath sounds: Decreased breath sounds, wheezing and rhonchi present.  Abdominal:     General:  There is no distension.  Skin:    General: Skin is warm.  Neurological:     Mental Status: She is alert and oriented to person, place, and time.     Cranial Nerves: No cranial nerve deficit.      ED Treatments / Results  Labs (all labs ordered are listed, but only abnormal results are displayed) Labs Reviewed  COMPREHENSIVE METABOLIC PANEL - Abnormal; Notable for the following components:      Result Value   Glucose, Bld 119 (*)    BUN 5 (*)    Alkaline  Phosphatase 207 (*)    All other components within normal limits  CBC WITH DIFFERENTIAL/PLATELET - Abnormal; Notable for the following components:   WBC 12.0 (*)    HCT 46.5 (*)    Platelets 489 (*)    Lymphs Abs 4.1 (*)    Eosinophils Absolute 3.4 (*)    All other components within normal limits  BRAIN NATRIURETIC PEPTIDE  TROPONIN I    EKG EKG Interpretation  Date/Time:  Wednesday February 20 2019 19:55:01 EDT Ventricular Rate:  93 PR Interval:    QRS Duration: 78 QT Interval:  388 QTC Calculation: 483 R Axis:   77 Text Interpretation:  Sinus rhythm Biatrial enlargement Probable anteroseptal infarct, old No significant change since last tracing Abnormal ekg Confirmed by Carmin Muskrat [phone]) on 02/20/2019 8:04:23 PM   Radiology Ct Angio Chest Pe W And/or Wo Contrast  Result Date: 02/20/2019 CLINICAL DATA:  Chest pain with positive D-dimer EXAM: CT ANGIOGRAPHY CHEST WITH CONTRAST TECHNIQUE: Multidetector CT imaging of the chest was performed using the standard protocol during bolus administration of intravenous contrast. Multiplanar CT image reconstructions and MIPs were obtained to evaluate the vascular anatomy. CONTRAST:  75mL OMNIPAQUE IOHEXOL 350 MG/ML SOLN COMPARISON:  None. FINDINGS: Cardiovascular: --Pulmonary arteries: Contrast injection is sufficient to demonstrate satisfactory opacification of the pulmonary arteries to the segmental level, with attenuation of 675 HU at the main pulmonary artery. There is no  pulmonary embolus. The main pulmonary artery is within normal limits for size. --Aorta: Limited opacification of the aorta due to bolus timing optimization for the pulmonary arteries. Conventional 3 vessel aortic branching pattern. The aortic course and caliber are normal. There is minimal aortic atherosclerosis. --Heart: Normal size. No pericardial effusion. Mediastinum/Nodes: No mediastinal, hilar or axillary lymphadenopathy. The visualized thyroid and thoracic esophageal course are unremarkable. Lungs/Pleura: No pulmonary nodules or masses. No pleural effusion or pneumothorax. No focal airspace consolidation. No focal pleural abnormality. Upper Abdomen: Contrast bolus timing is not optimized for evaluation of the abdominal organs. Within this limitation, the visualized organs of the upper abdomen are normal. Musculoskeletal: No chest wall abnormality. No acute or significant osseous findings. Bilateral breast implants. Review of the MIP images confirms the above findings. IMPRESSION: No pulmonary embolus or other acute thoracic abnormality. Electronically Signed   By: Ulyses Jarred M.D.   On: 02/20/2019 21:49   Dg Chest Port 1 View  Result Date: 02/20/2019 CLINICAL DATA:  Shortness of breath and chest pain EXAM: PORTABLE CHEST 1 VIEW COMPARISON:  02/13/2019 FINDINGS: The heart size and mediastinal contours are within normal limits. Both lungs are clear. The visualized skeletal structures are unremarkable. IMPRESSION: No active disease. Electronically Signed   By: Ulyses Jarred M.D.   On: 02/20/2019 20:50    Procedures Procedures (including critical care time)  Medications Ordered in ED Medications  methylPREDNISolone sodium succinate (SOLU-MEDROL) 125 mg/2 mL injection 125 mg (125 mg Intravenous Given 02/20/19 2032)  albuterol (VENTOLIN HFA) 108 (90 Base) MCG/ACT inhaler 2 puff (2 puffs Inhalation Given 02/20/19 2032)  iohexol (OMNIPAQUE) 350 MG/ML injection 75 mL (75 mLs Intravenous Contrast Given  02/20/19 2126)     Initial Impression / Assessment and Plan / ED Course  I have reviewed the triage vital signs and the nursing notes.  Pertinent labs & imaging results that were available during my care of the patient were reviewed by me and considered in my medical decision making (see chart for details).    View of the patient's chart from visit 1 week  ago, notable for elevated d-dimer, negative initial coronavirus testing result, above plan for repeat testing given the patient's persistent dyspnea. Here, initial findings are notable for absence of fever, and point-of-care x-ray is generally unremarkable. With concern for possible COPD exacerbation and ongoing increased work of breathing patient received albuterol, steroids after my initial evaluation.    11:05 PM Patient has hypoxia on room air, after she has had some subjective improvement in diminished wheezing, however. With this new hypoxia, with no evidence for PE, no evidence for ACS, there is suspicion for COPD exacerbation. CT inconsistent with COVID, and the patient has had 1- COVID test within the past week. For need for further treatment for COPD exacerbation with new oxygen requirement she was admitted for further monitoring, management.  Final Clinical Impressions(s) / ED Diagnoses  COPD exacerbation   Carmin Muskrat, MD 02/20/19 2306

## 2019-02-20 NOTE — ED Notes (Signed)
  Patient called out stating she could not breathe.  SPO2 level had dropped to 86%.  Patient was placed back on 3L [place] and is now at 93%.

## 2019-02-20 NOTE — H&P (Addendum)
[facility] Admission History and Physical Service Pager: [phone]  Patient name: Tracey Ferrell record number: [id number] Date of birth: 10-09-1951 Age: 68 y.o. Gender: female  Primary Care Provider: Sherene Sires, DO Consultants: none Code Status: Full, confirmed in ED Emergency contact: Pearson Forster [phone]  Chief Complaint: SOB  Assessment and Plan: Yolonda Purtle is a 68 y.o. female presenting with. PMH is significant for COPD, hypertension, overactive bladder, pelvic mass.   Shortness of Breath  COPD exacerbation  Patient presented to ED on 4/15 and was admitted for likely COPD exacerbation. She was COVID-19 negative at that time with low pretest suspicion, however patient left AMA. Patient now returning with increased shortness of breath, cough and fatigue that has been worsening since she left the hospital. She states that she has been compliant with her home medication of Symbicort, Spiriva and albuterol. Patient states that she has been using her albuterol more and more. Patient denies any fevers or sick contacts. She denies any orthopnea.  Rapid COVID-19 test in the emergency department is negative.  Patient with desaturations to 86% when removed from oxygen. She is able to speak in 3-4 word sentences on new O2 requirement of 5L [place]. CXR with no signs of acute disease. During last admission patient with elevated d-dimer and CTA was obtained in ED which showed no signs of PE and findings were also inconsistent with COVID-19. Patient denies any chest pain and initial troponin in the ED was negative. EKG is unchanged from prior. Patient with no history of heart failure and no signs of fluid overload. BNP normal at 79.8 and no crackles noted on exam. Patient received Solu-Medrol, albuterol in the ED without improvement.  -Admit to [place] attending, Dr. McDiarmid -Rapid COVID-19 test negative in ED. Patient with normal CXR and no signs consistent with COVID  on CTA however patient endorsing shortness of breath, weakness and cough -Combivent 1 puff every 6 hours -Albuterol 2 puffs every 2 hours as needed -Azithromycin 500 mg x5 days (4/23-) -Prednisone 40 mg x 5 days (4/23-) -O2 supplementation to keep sats between 88-92%  -Will FYI CCM given that patient requiring > 4 L [place]  Pelvic Mass Abdominal CT on 02/13/2019 showed complex cystic lesion in central pelvis and lower abdomen, highly suspicious for low-grade ovarian cystadenocarcinoma. -Patient will need further follow-up for pelvic mass  Hypertension Patient initially hypertensive in ED to 198/112 which improved after patient's respiratory status improved.  States that she is compliant with her amlodipine at home.  However, patient has not been seen by primary care provider in many years. -Continue home amlodipine  Fibromyalgia Patient reports that she takes gabapentin every night for her pain.  -Continue home gabapentin 300 mg nightly  Dyslipidemia Patient reports that she is compliant with Lipitor. -Continue home atorvastatin  Overactive bladder Patient takes [place].  Pelvic mass may be contributing to patient's symptoms. -Continue home Toviaz  Mild thrombocytosis  leukocytosis Platelets 489 on admission.  WBC elevated 12.0.  Likely related to patient's recent steroid use.  No signs of infection at this time. -Monitor CBC  FEN/GI: Heart healthy diet Prophylaxis: Lovenox  Disposition: admit to med-surg  History of Present Illness:  Tracey Ferrell is a 68 y.o. female presenting with worsening shortness of breath and weakness for the past 2 weeks.  Patient was in the emergency review on 4/15 and left AMA after having some issues with switching her rooms.  Patient reports that she went home and continue to take her  home medications of Symbicort, Kariva and albuterol however she needed more more albuterol with increasing shortness of breath.  Patient states that she has been hospitalized  before for COPD but it is been many years.  Patient reports that she has been using her albuterol too much but is unable to tell me exactly how much she is been using it.  States that she has been having any trouble laying down however she is unable to walk without being short of breath.  Patient states that she has not had any fevers, chills.  Reports she has not been around any sick contacts.  Patient states that she has been social isolating during the COVID-19 crisis.  Patient denies any chest pain, orthopnea, or leg swelling.  States that she is having trouble catching her breath and is noticing that she is wheezing more and more.  Patient also states that she feels as if her abdomen is getting larger and larger.  She states that it feels as if she is pregnant.  She denies any pain.  States that she is unsure what is going on but she has been told that she has a mass in her belly.  Patient states that she does not know with his masses but she is concerned that her belly keeps getting larger.  She had normal bowel movement on 4/22 prior to coming into the emergency room without any blood or dark stool noted.  Review Of Systems: Per HPI with the following additions:   Review of Systems  Constitutional: Positive for malaise/fatigue. Negative for fever.  HENT: Negative for congestion and sore throat.   Eyes: Negative for blurred vision and double vision.  Respiratory: Positive for cough, shortness of breath and wheezing. Negative for sputum production.   Cardiovascular: Negative for chest pain, orthopnea and leg swelling.  Gastrointestinal: Negative for abdominal pain, constipation, diarrhea, nausea and vomiting.       Distention  Genitourinary: Negative for dysuria.    Patient Active Problem List   Diagnosis Date Noted  . COPD (chronic obstructive pulmonary disease) ([place]) 02/13/2019  . LOC (loss of consciousness) ([place]) 04/14/2015  . COPD exacerbation ([place]) 04/13/2015  . Acute respiratory  failure with hypoxia ([place]) 04/13/2015  . Depression 04/13/2015  . Fibromyalgia 04/13/2015  . Polycythemia 04/13/2015  . Bronchitis, acute 04/13/2015  . Dyslipidemia 04/13/2015  . HTN (hypertension) 04/13/2015    Past Medical History: Past Medical History:  Diagnosis Date  . Asthma   . Back pain   . Borderline diabetic   . COPD (chronic obstructive pulmonary disease) ([place])   . Depression   . Hyperlipidemia   . Hypertension   . Shortness of breath dyspnea     Past Surgical History: Past Surgical History:  Procedure Laterality Date  . COSMETIC SURGERY      Social History: Social History   Tobacco Use  . Smoking status: Former Smoker    Packs/day: 0.50    Years: 44.00    Pack years: 22.00    Types: Cigarettes    Last attempt to quit: 12/30/2014    Years since quitting: 4.1  . Smokeless tobacco: Never Used  Substance Use Topics  . Alcohol use: No    Comment: former  . Drug use: No   Additional social history: Patient denies any alcohol use or illicit drug use Please also refer to relevant sections of EMR.  Family History: No family history on file. Patient reports that she does not have any known family history of heart  disease, high blood pressure or diabetes.  Allergies and Medications: No Known Allergies No current facility-administered medications on file prior to encounter.    Current Outpatient Medications on File Prior to Encounter  Medication Sig Dispense Refill  . albuterol (PROVENTIL HFA;VENTOLIN HFA) 108 (90 BASE) MCG/ACT inhaler Inhale 2 puffs into the lungs every 4 (four) hours as needed for wheezing or shortness of breath. 1 Inhaler 5  . amLODipine (NORVASC) 5 MG tablet Take 1 tablet (5 mg total) by mouth daily. 30 tablet 3  . ANORO ELLIPTA 62.5-25 MCG/INH AEPB Inhale 1 puff into the lungs daily.    Marland Kitchen atorvastatin (LIPITOR) 20 MG tablet Take 20 mg by mouth daily.    Marland Kitchen azithromycin (ZITHROMAX) 500 MG tablet Take 1 tablet (500 mg total) by mouth  daily. X 1 week (Patient not taking: Reported on 02/13/2019) 7 tablet 0  . budesonide-formoterol (SYMBICORT) 80-4.5 MCG/ACT inhaler Inhale 2 puffs into the lungs 2 (two) times daily. (Patient not taking: Reported on 02/13/2019) 1 Inhaler 12  . cephALEXin (KEFLEX) 500 MG capsule Take 1 capsule (500 mg total) by mouth 3 (three) times daily. (Patient not taking: Reported on 02/13/2019) 21 capsule 0  . cholecalciferol (VITAMIN D3) 25 MCG (1000 UT) tablet Take 1,000 Units by mouth daily.    Marland Kitchen gabapentin (NEURONTIN) 300 MG capsule Take 300 mg by mouth at bedtime as needed for pain.    Marland Kitchen guaifenesin (ROBITUSSIN) 100 MG/5ML syrup Take 10 mLs (200 mg total) by mouth every 4 (four) hours as needed for congestion. (Patient not taking: Reported on 02/13/2019) 120 mL 0  . HYDROcodone-acetaminophen (NORCO/VICODIN) 5-325 MG tablet Take 1 tablet by mouth every 6 (six) hours as needed for severe pain. (Patient not taking: Reported on 02/13/2019) 5 tablet 0  . ibuprofen (ADVIL,MOTRIN) 200 MG tablet Take 600 mg by mouth every 6 (six) hours as needed for moderate pain.    Marland Kitchen omeprazole (PRILOSEC) 20 MG capsule Take 20 mg by mouth daily.    . pravastatin (PRAVACHOL) 40 MG tablet Take 1 tablet (40 mg total) by mouth daily. (Patient not taking: Reported on 02/13/2019) 30 tablet 4  . predniSONE (DELTASONE) 10 MG tablet Prednisone dosing: Take  Prednisone 72m (4 tabs) x 3 days, then taper to 326m(3 tabs) x 3 days, then 202m2 tabs) x 3days, then 52m58m tab) x 3days, then OFF.  Dispense:  30 tabs, refills: None (Patient not taking: Reported on 02/13/2019) 30 tablet 0  . tiotropium (SPIRIVA) 18 MCG inhalation capsule Place 1 capsule (18 mcg total) into inhaler and inhale daily. (Patient not taking: Reported on 02/13/2019) 30 capsule 12  . TOVIAZ 4 MG TB24 tablet Take 4 mg by mouth daily.      Objective: BP (!) 160/98   Pulse 88   Temp 97.8 F (36.6 C) (Oral)   Resp 17   SpO2 99%  Exam: General: NAD, pleasant, able to talk  in 3-4 word sentences Eyes: PERRL, EOMI, no conjunctival pallor or injection ENTM: Moist mucous membranes, no pharyngeal erythema or exudate Neck: Supple, no LAD Cardiovascular: RRR, no m/r/g, no LE edema Respiratory: Diminished breath sounds throughout with tightness diffuse expiratory wheeze on 5 L [place] Gastrointestinal: soft, nontender, distended MSK: moves 4 extremities equally Derm: no rashes appreciated Neuro: CN II-XII grossly intact Psych: AOx3, appropriate affect  Labs and Imaging: CBC BMET  Recent Labs  Lab 02/20/19 2009  WBC 12.0*  HGB 14.8  HCT 46.5*  PLT 489*   Recent Labs  Lab 02/20/19 2009  NA 140  K 3.5  CL 101  CO2 26  BUN 5*  CREATININE 0.71  GLUCOSE 119*  CALCIUM 9.7     BNP 79.8 Troponin <0.03 Alk phos 207 AST 22 ALT 20  Ct Angio Chest Pe W And/or Wo Contrast  Result Date: 02/20/2019 CLINICAL DATA:  Chest pain with positive D-dimer EXAM: CT ANGIOGRAPHY CHEST WITH CONTRAST TECHNIQUE: Multidetector CT imaging of the chest was performed using the standard protocol during bolus administration of intravenous contrast. Multiplanar CT image reconstructions and MIPs were obtained to evaluate the vascular anatomy. CONTRAST:  48m OMNIPAQUE IOHEXOL 350 MG/ML SOLN COMPARISON:  None. FINDINGS: Cardiovascular: --Pulmonary arteries: Contrast injection is sufficient to demonstrate satisfactory opacification of the pulmonary arteries to the segmental level, with attenuation of 675 HU at the main pulmonary artery. There is no pulmonary embolus. The main pulmonary artery is within normal limits for size. --Aorta: Limited opacification of the aorta due to bolus timing optimization for the pulmonary arteries. Conventional 3 vessel aortic branching pattern. The aortic course and caliber are normal. There is minimal aortic atherosclerosis. --Heart: Normal size. No pericardial effusion. Mediastinum/Nodes: No mediastinal, hilar or axillary lymphadenopathy. The visualized thyroid  and thoracic esophageal course are unremarkable. Lungs/Pleura: No pulmonary nodules or masses. No pleural effusion or pneumothorax. No focal airspace consolidation. No focal pleural abnormality. Upper Abdomen: Contrast bolus timing is not optimized for evaluation of the abdominal organs. Within this limitation, the visualized organs of the upper abdomen are normal. Musculoskeletal: No chest wall abnormality. No acute or significant osseous findings. Bilateral breast implants. Review of the MIP images confirms the above findings. IMPRESSION: No pulmonary embolus or other acute thoracic abnormality. Electronically Signed   By: KUlyses JarredM.D.   On: 02/20/2019 21:49   Dg Chest Port 1 View  Result Date: 02/20/2019 CLINICAL DATA:  Shortness of breath and chest pain EXAM: PORTABLE CHEST 1 VIEW COMPARISON:  02/13/2019 FINDINGS: The heart size and mediastinal contours are within normal limits. Both lungs are clear. The visualized skeletal structures are unremarkable. IMPRESSION: No active disease. Electronically Signed   By: KUlyses JarredM.D.   On: 02/20/2019 20:50    Merna Baldi, JMartinique DO 02/21/2019, 1:11 AM PGY-2, [place]Intern pager: [phone] text pages welcome

## 2019-02-21 ENCOUNTER — Other Ambulatory Visit: Payer: Self-pay

## 2019-02-21 ENCOUNTER — Encounter (HOSPITAL_COMMUNITY): Payer: Self-pay | Admitting: Family Medicine

## 2019-02-21 DIAGNOSIS — J441 Chronic obstructive pulmonary disease with (acute) exacerbation: Secondary | ICD-10-CM | POA: Diagnosis not present

## 2019-02-21 DIAGNOSIS — J9601 Acute respiratory failure with hypoxia: Secondary | ICD-10-CM

## 2019-02-21 DIAGNOSIS — M797 Fibromyalgia: Secondary | ICD-10-CM | POA: Diagnosis present

## 2019-02-21 DIAGNOSIS — N3281 Overactive bladder: Secondary | ICD-10-CM | POA: Diagnosis present

## 2019-02-21 DIAGNOSIS — Z9981 Dependence on supplemental oxygen: Secondary | ICD-10-CM | POA: Diagnosis not present

## 2019-02-21 DIAGNOSIS — R739 Hyperglycemia, unspecified: Secondary | ICD-10-CM | POA: Diagnosis not present

## 2019-02-21 DIAGNOSIS — J9621 Acute and chronic respiratory failure with hypoxia: Secondary | ICD-10-CM | POA: Diagnosis present

## 2019-02-21 DIAGNOSIS — C569 Malignant neoplasm of unspecified ovary: Secondary | ICD-10-CM | POA: Diagnosis present

## 2019-02-21 DIAGNOSIS — I119 Hypertensive heart disease without heart failure: Secondary | ICD-10-CM | POA: Diagnosis present

## 2019-02-21 DIAGNOSIS — D751 Secondary polycythemia: Secondary | ICD-10-CM | POA: Diagnosis present

## 2019-02-21 DIAGNOSIS — J439 Emphysema, unspecified: Secondary | ICD-10-CM | POA: Diagnosis present

## 2019-02-21 DIAGNOSIS — F419 Anxiety disorder, unspecified: Secondary | ICD-10-CM | POA: Diagnosis present

## 2019-02-21 DIAGNOSIS — E785 Hyperlipidemia, unspecified: Secondary | ICD-10-CM | POA: Diagnosis present

## 2019-02-21 DIAGNOSIS — N83299 Other ovarian cyst, unspecified side: Secondary | ICD-10-CM | POA: Diagnosis not present

## 2019-02-21 DIAGNOSIS — R7309 Other abnormal glucose: Secondary | ICD-10-CM | POA: Diagnosis not present

## 2019-02-21 DIAGNOSIS — Z20828 Contact with and (suspected) exposure to other viral communicable diseases: Secondary | ICD-10-CM | POA: Diagnosis present

## 2019-02-21 DIAGNOSIS — R0602 Shortness of breath: Secondary | ICD-10-CM | POA: Diagnosis present

## 2019-02-21 DIAGNOSIS — Z87891 Personal history of nicotine dependence: Secondary | ICD-10-CM | POA: Diagnosis not present

## 2019-02-21 DIAGNOSIS — Z7951 Long term (current) use of inhaled steroids: Secondary | ICD-10-CM | POA: Diagnosis not present

## 2019-02-21 DIAGNOSIS — Z79899 Other long term (current) drug therapy: Secondary | ICD-10-CM | POA: Diagnosis not present

## 2019-02-21 LAB — COMPREHENSIVE METABOLIC PANEL
ALT: 20 U/L (ref 0–44)
AST: 19 U/L (ref 15–41)
Albumin: 3.8 g/dL (ref 3.5–5.0)
Alkaline Phosphatase: 209 U/L — ABNORMAL HIGH (ref 38–126)
Anion gap: 10 (ref 5–15)
BUN: 11 mg/dL (ref 8–23)
CO2: 28 mmol/L (ref 22–32)
Calcium: 9.4 mg/dL (ref 8.9–10.3)
Chloride: 103 mmol/L (ref 98–111)
Creatinine, Ser: 0.76 mg/dL (ref 0.44–1.00)
GFR calc Af Amer: >60 mL/min (ref >60)
GFR calc non Af Amer: >60 mL/min (ref >60)
Glucose, Bld: 166 mg/dL — ABNORMAL HIGH (ref 70–99)
Potassium: 4.4 mmol/L (ref 3.5–5.1)
Sodium: 141 mmol/L (ref 135–145)
Total Bilirubin: 0.6 mg/dL (ref 0.3–1.2)
Total Protein: 7.3 g/dL (ref 6.5–8.1)

## 2019-02-21 LAB — SARS CORONAVIRUS 2 BY RT PCR (HOSPITAL ORDER, PERFORMED IN ~~LOC~~ HOSPITAL LAB): SARS Coronavirus 2: NEGATIVE

## 2019-02-21 LAB — CBC
HCT: 43.5 % (ref 36.0–46.0)
Hemoglobin: 14 g/dL (ref 12.0–15.0)
MCH: 29.7 pg (ref 26.0–34.0)
MCHC: 32.2 g/dL (ref 30.0–36.0)
MCV: 92.4 fL (ref 80.0–100.0)
Platelets: 453 10*3/uL — ABNORMAL HIGH (ref 150–400)
RBC: 4.71 MIL/uL (ref 3.87–5.11)
RDW: 13.5 % (ref 11.5–15.5)
WBC: 5.6 10*3/uL (ref 4.0–10.5)
nRBC: 0 % (ref 0.0–0.2)

## 2019-02-21 LAB — GLUCOSE, CAPILLARY
Glucose-Capillary: 152 mg/dL — ABNORMAL HIGH (ref 70–99)
Glucose-Capillary: 136 mg/dL — ABNORMAL HIGH (ref 70–99)

## 2019-02-21 MED ORDER — AZITHROMYCIN 250 MG PO TABS
250.0000 mg | ORAL_TABLET | Freq: Every day | ORAL | Status: AC
  Administered 2019-02-22 – 2019-02-25 (×4): 250 mg via ORAL
  Filled 2019-02-21 (×4): qty 1

## 2019-02-21 MED ORDER — ORAL CARE MOUTH RINSE
15.0000 mL | Freq: Two times a day (BID) | OROMUCOSAL | Status: DC
  Administered 2019-02-21 – 2019-02-25 (×7): 15 mL via OROMUCOSAL

## 2019-02-21 MED ORDER — INSULIN ASPART 100 UNIT/ML ~~LOC~~ SOLN
0.0000 [IU] | Freq: Three times a day (TID) | SUBCUTANEOUS | Status: DC
  Administered 2019-02-21 – 2019-02-22 (×3): 2 [IU] via SUBCUTANEOUS
  Administered 2019-02-23 – 2019-02-24 (×2): 1 [IU] via SUBCUTANEOUS
  Administered 2019-02-24: 3 [IU] via SUBCUTANEOUS

## 2019-02-21 MED ORDER — IPRATROPIUM-ALBUTEROL 20-100 MCG/ACT IN AERS: 1.0000 | INHALATION_SPRAY | RESPIRATORY_TRACT | Status: DC

## 2019-02-21 MED ORDER — ENOXAPARIN SODIUM 40 MG/0.4ML ~~LOC~~ SOLN
40.0000 mg | SUBCUTANEOUS | Status: DC
  Administered 2019-02-21 – 2019-02-25 (×5): 40 mg via SUBCUTANEOUS
  Filled 2019-02-21 (×5): qty 0.4

## 2019-02-21 MED ORDER — PREDNISONE 20 MG PO TABS
40.0000 mg | ORAL_TABLET | Freq: Every day | ORAL | Status: AC
  Administered 2019-02-21 – 2019-02-24 (×4): 40 mg via ORAL
  Filled 2019-02-21 (×4): qty 2

## 2019-02-21 MED ORDER — IPRATROPIUM-ALBUTEROL 0.5-2.5 (3) MG/3ML IN SOLN
3.0000 mL | Freq: Four times a day (QID) | RESPIRATORY_TRACT | Status: DC
  Administered 2019-02-21 – 2019-02-24 (×13): 3 mL via RESPIRATORY_TRACT
  Filled 2019-02-21 (×13): qty 3

## 2019-02-21 MED ORDER — FESOTERODINE FUMARATE ER 4 MG PO TB24
4.0000 mg | ORAL_TABLET | Freq: Every day | ORAL | Status: DC
  Administered 2019-02-21 – 2019-02-25 (×5): 4 mg via ORAL
  Filled 2019-02-21 (×6): qty 1

## 2019-02-21 MED ORDER — POLYETHYLENE GLYCOL 3350 17 G PO PACK: 17.0000 g | PACK | Freq: Every day | ORAL | Status: DC | PRN

## 2019-02-21 MED ORDER — ACETAMINOPHEN 325 MG PO TABS: 650.0000 mg | ORAL_TABLET | Freq: Four times a day (QID) | ORAL | Status: DC | PRN

## 2019-02-21 MED ORDER — PHENOL 1.4 % MT LIQD: 1.0000 | OROMUCOSAL | Status: DC | PRN

## 2019-02-21 MED ORDER — ENOXAPARIN SODIUM 30 MG/0.3ML ~~LOC~~ SOLN: 30.0000 mg | SUBCUTANEOUS | Status: DC

## 2019-02-21 MED ORDER — ATORVASTATIN CALCIUM 10 MG PO TABS
20.0000 mg | ORAL_TABLET | Freq: Every day | ORAL | Status: DC
  Administered 2019-02-21 – 2019-02-25 (×5): 20 mg via ORAL
  Filled 2019-02-21 (×5): qty 2

## 2019-02-21 MED ORDER — AZITHROMYCIN 250 MG PO TABS
500.0000 mg | ORAL_TABLET | Freq: Every day | ORAL | Status: DC
  Administered 2019-02-21: 500 mg via ORAL
  Filled 2019-02-21: qty 2

## 2019-02-21 MED ORDER — AMLODIPINE BESYLATE 5 MG PO TABS
5.0000 mg | ORAL_TABLET | Freq: Every day | ORAL | Status: DC
  Administered 2019-02-21 – 2019-02-25 (×5): 5 mg via ORAL
  Filled 2019-02-21 (×5): qty 1

## 2019-02-21 MED ORDER — ACETAMINOPHEN 650 MG RE SUPP: 650.0000 mg | Freq: Four times a day (QID) | RECTAL | Status: DC | PRN

## 2019-02-21 MED ORDER — GABAPENTIN 300 MG PO CAPS
300.0000 mg | ORAL_CAPSULE | Freq: Every day | ORAL | Status: DC
  Administered 2019-02-21 – 2019-02-24 (×5): 300 mg via ORAL
  Filled 2019-02-21 (×5): qty 1

## 2019-02-21 MED ORDER — ALBUTEROL SULFATE HFA 108 (90 BASE) MCG/ACT IN AERS: 2.0000 | INHALATION_SPRAY | RESPIRATORY_TRACT | Status: DC

## 2019-02-21 MED ORDER — IPRATROPIUM-ALBUTEROL 20-100 MCG/ACT IN AERS
1.0000 | INHALATION_SPRAY | Freq: Four times a day (QID) | RESPIRATORY_TRACT | Status: DC
  Filled 2019-02-21: qty 4

## 2019-02-21 MED ORDER — BENZONATATE 100 MG PO CAPS
100.0000 mg | ORAL_CAPSULE | Freq: Two times a day (BID) | ORAL | Status: DC | PRN
  Administered 2019-02-21 – 2019-02-25 (×7): 100 mg via ORAL
  Filled 2019-02-21 (×7): qty 1

## 2019-02-21 MED ORDER — ALBUTEROL SULFATE HFA 108 (90 BASE) MCG/ACT IN AERS
2.0000 | INHALATION_SPRAY | RESPIRATORY_TRACT | Status: DC | PRN
  Filled 2019-02-21: qty 6.7

## 2019-02-21 NOTE — Plan of Care (Signed)
  Problem: Activity: Goal: Ability to tolerate increased activity will improve Outcome: Progressing   Problem: Respiratory: Goal: Ability to maintain a clear airway will improve Outcome: Progressing Goal: Levels of oxygenation will improve Outcome: Progressing

## 2019-02-21 NOTE — ED Notes (Signed)
ED TO INPATIENT HANDOFF REPORT  ED Nurse Name and Phone #:  Lenice Pressman [phone]  S Name/Age/Gender Tracey Ferrell 68 y.o. female Room/Bed: 027C/027C  Code Status   Code Status: Full Code  Home/SNF/Other Home Patient oriented to: self, place, time and situation Is this baseline? Yes   Triage Complete: Triage complete  Chief Complaint SOB  Triage Note Patient from home, with shortness of breath for the last few weeks.  She was seen a few days ago for the same.  She was found with sats in the low 80's.  Patient placed on NRB and feeling better with sats in the high 90's.  She was tested for [facility] and flu on 4/15 and was negative.  Congested cough.   Allergies No Known Allergies  Level of Care/Admitting Diagnosis ED Disposition    ED Disposition Condition [facility] Area: [place] [[id number]]  Level of Care: Med-Surg [16]  Covid Evaluation: N/A  Diagnosis: COPD exacerbation [facility]) [[id number]]  Admitting Physician: SHIRLEY, [place] [[id number]]  Attending Physician: MCDIARMID, TODD D [[id number]]  Estimated length of stay: past midnight tomorrow  Certification:: I certify this patient will need inpatient services for at least 2 midnights  PT Class (Do Not Modify): Inpatient [101]  PT Acc Code (Do Not Modify): Private [1]       B Medical/Surgery History Past Medical History:  Diagnosis Date  . Asthma   . Back pain   . Borderline diabetic   . COPD (chronic obstructive pulmonary disease) ([place])   . Depression   . Hyperlipidemia   . Hypertension   . Shortness of breath dyspnea    Past Surgical History:  Procedure Laterality Date  . COSMETIC SURGERY       A IV Location/Drains/Wounds Patient Lines/Drains/Airways Status   Active Line/Drains/Airways    Name:   Placement date:   Placement time:   Site:   Days:   Peripheral IV 02/20/19 Right Forearm   02/20/19    2005    Forearm   1   Peripheral IV 02/20/19 Left Antecubital   02/20/19    2056     Antecubital   1          Intake/Output Last 24 hours No intake or output data in the 24 hours ending 02/21/19 0227  Labs/Imaging Results for orders placed or performed during the hospital encounter of 02/20/19 (from the past 48 hour(s))  Comprehensive metabolic panel     Status: Abnormal   Collection Time: 02/20/19  8:09 PM  Result Value Ref Range   Sodium 140 135 - 145 mmol/L   Potassium 3.5 3.5 - 5.1 mmol/L   Chloride 101 98 - 111 mmol/L   CO2 26 22 - 32 mmol/L   Glucose, Bld 119 (H) 70 - 99 mg/dL   BUN 5 (L) 8 - 23 mg/dL   Creatinine, Ser 0.71 0.44 - 1.00 mg/dL   Calcium 9.7 8.9 - 10.3 mg/dL   Total Protein 8.0 6.5 - 8.1 g/dL   Albumin 4.2 3.5 - 5.0 g/dL   AST 22 15 - 41 U/L   ALT 20 0 - 44 U/L   Alkaline Phosphatase 207 (H) 38 - 126 U/L   Total Bilirubin 0.7 0.3 - 1.2 mg/dL   GFR calc non Af Amer >60 >60 mL/min   GFR calc Af Amer >60 >60 mL/min   Anion gap 13 5 - 15    Comment: Performed at [facility], [address] [place], [place] [address]  Brain natriuretic peptide     Status: None   Collection Time: 02/20/19  8:09 PM  Result Value Ref Range   B Natriuretic Peptide 79.8 0.0 - 100.0 pg/mL    Comment: Performed at [facility] [address] [place], [place] [address]  Troponin I - Once     Status: None   Collection Time: 02/20/19  8:09 PM  Result Value Ref Range   Troponin I <0.03 <0.03 ng/mL    Comment: Performed at [facility] [address], [place], [place] [address]  CBC with Differential     Status: Abnormal   Collection Time: 02/20/19  8:09 PM  Result Value Ref Range   WBC 12.0 (H) 4.0 - 10.5 K/uL   RBC 5.02 3.87 - 5.11 MIL/uL   Hemoglobin 14.8 12.0 - 15.0 g/dL   HCT 46.5 (H) 36.0 - 46.0 %   MCV 92.6 80.0 - 100.0 fL   MCH 29.5 26.0 - 34.0 pg   MCHC 31.8 30.0 - 36.0 g/dL   RDW 13.6 11.5 - 15.5 %   Platelets 489 (H) 150 - 400 K/uL   nRBC 0.0 0.0 - 0.2 %   Neutrophils Relative % 35 %   Neutro Abs 4.2 1.7 - 7.7 K/uL    Lymphocytes Relative 34 %   Lymphs Abs 4.1 (H) 0.7 - 4.0 K/uL   Monocytes Relative 3 %   Monocytes Absolute 0.4 0.1 - 1.0 K/uL   Eosinophils Relative 28 %   Eosinophils Absolute 3.4 (H) 0.0 - 0.5 K/uL   Basophils Relative 0 %   Basophils Absolute 0.0 0.0 - 0.1 K/uL   nRBC 0 0 /100 WBC   Abs Immature Granulocytes 0.00 0.00 - 0.07 K/uL    Comment: Performed at [facility], [place] [address] [place],  [address]  SARS Coronavirus 2 [facility] order, Performed in [facility] hospital lab)     Status: None   Collection Time: 02/20/19 11:40 PM  Result Value Ref Range   SARS Coronavirus 2 NEGATIVE NEGATIVE    Comment: (NOTE) If result is NEGATIVE SARS-CoV-2 target nucleic acids are NOT DETECTED. The SARS-CoV-2 RNA is generally detectable in upper and lower  respiratory specimens during the acute phase of infection. The lowest  concentration of SARS-CoV-2 viral copies this assay can detect is 250  copies / mL. A negative result does not preclude SARS-CoV-2 infection  and should not be used as the sole basis for treatment or other  patient management decisions.  A negative result may occur with  improper specimen collection / handling, submission of specimen other  than nasopharyngeal swab, presence of viral mutation(s) within the  areas targeted by this assay, and inadequate number of viral copies  (<250 copies / mL). A negative result must be combined with clinical  observations, patient history, and epidemiological information. If result is POSITIVE SARS-CoV-2 target nucleic acids are DETECTED. The SARS-CoV-2 RNA is generally detectable in upper and lower  respiratory specimens dur ing the acute phase of infection.  Positive  results are indicative of active infection with SARS-CoV-2.  Clinical  correlation with patient history and other diagnostic information is  necessary to determine patient infection status.  Positive results do  not rule out bacterial infection or  co-infection with other viruses. If result is PRESUMPTIVE POSTIVE SARS-CoV-2 nucleic acids MAY BE PRESENT.   A presumptive positive result was obtained on the submitted specimen  and confirmed on repeat  testing.  While 2019 novel coronavirus  (SARS-CoV-2) nucleic acids may be present in the submitted sample  additional confirmatory testing may be necessary for epidemiological  and / or clinical management purposes  to differentiate between  SARS-CoV-2 and other Sarbecovirus currently known to infect humans.  If clinically indicated additional testing with an alternate test  methodology [phone]) is advised. The SARS-CoV-2 RNA is generally  detectable in upper and lower respiratory sp ecimens during the acute  phase of infection. The expected result is Negative. Fact Sheet for Patients:  StrictlyIdeas.no Fact Sheet for Healthcare Providers: BankingDealers.co.za This test is not yet approved or cleared by the [place] FDA and has been authorized for detection and/or diagnosis of SARS-CoV-2 by FDA under an Emergency Use Authorization (EUA).  This EUA will remain in effect (meaning this test can be used) for the duration of the COVID-19 declaration under Section 564(b)(1) of the Act, 21 U.S.C. section 360bbb-3(b)(1), unless the authorization is terminated or revoked sooner. Performed at [facility], [place] [address], [place], [place] [address]    Ct Angio Chest Pe W And/or Wo Contrast  Result Date: 02/20/2019 CLINICAL DATA:  Chest pain with positive D-dimer EXAM: CT ANGIOGRAPHY CHEST WITH CONTRAST TECHNIQUE: Multidetector CT imaging of the chest was performed using the standard protocol during bolus administration of intravenous contrast. Multiplanar CT image reconstructions and MIPs were obtained to evaluate the vascular anatomy. CONTRAST:  22mL OMNIPAQUE IOHEXOL 350 MG/ML SOLN COMPARISON:  None. FINDINGS: Cardiovascular: --Pulmonary  arteries: Contrast injection is sufficient to demonstrate satisfactory opacification of the pulmonary arteries to the segmental level, with attenuation of 675 HU at the main pulmonary artery. There is no pulmonary embolus. The main pulmonary artery is within normal limits for size. --Aorta: Limited opacification of the aorta due to bolus timing optimization for the pulmonary arteries. Conventional 3 vessel aortic branching pattern. The aortic course and caliber are normal. There is minimal aortic atherosclerosis. --Heart: Normal size. No pericardial effusion. Mediastinum/Nodes: No mediastinal, hilar or axillary lymphadenopathy. The visualized thyroid and thoracic esophageal course are unremarkable. Lungs/Pleura: No pulmonary nodules or masses. No pleural effusion or pneumothorax. No focal airspace consolidation. No focal pleural abnormality. Upper Abdomen: Contrast bolus timing is not optimized for evaluation of the abdominal organs. Within this limitation, the visualized organs of the upper abdomen are normal. Musculoskeletal: No chest wall abnormality. No acute or significant osseous findings. Bilateral breast implants. Review of the MIP images confirms the above findings. IMPRESSION: No pulmonary embolus or other acute thoracic abnormality. Electronically Signed   By: Ulyses Jarred M.D.   On: 02/20/2019 21:49   Dg Chest Port 1 View  Result Date: 02/20/2019 CLINICAL DATA:  Shortness of breath and chest pain EXAM: PORTABLE CHEST 1 VIEW COMPARISON:  02/13/2019 FINDINGS: The heart size and mediastinal contours are within normal limits. Both lungs are clear. The visualized skeletal structures are unremarkable. IMPRESSION: No active disease. Electronically Signed   By: Ulyses Jarred M.D.   On: 02/20/2019 20:50    Pending Labs Unresulted Labs (From admission, onward)    Start     Ordered   02/21/19 0500  Comprehensive metabolic panel  Tomorrow morning,   R     02/21/19 0025   02/21/19 0500  CBC  Tomorrow  morning,   R     02/21/19 0025          Vitals/Pain Today's Vitals   02/21/19 0030 02/21/19 0100 02/21/19 0130 02/21/19 0200  BP: (!) 160/98 133/85 116/73 108/73  Pulse: 88 91 76 75  Resp: 17 (!) 22 18 15   Temp:      TempSrc:      SpO2: 99% 97% 99% 95%  PainSc:        Isolation Precautions No active isolations  Medications Medications  enoxaparin (LOVENOX) injection 40 mg (has no administration in time range)  acetaminophen (TYLENOL) tablet 650 mg (has no administration in time range)    Or  acetaminophen (TYLENOL) suppository 650 mg (has no administration in time range)  polyethylene glycol (MIRALAX / GLYCOLAX) packet 17 g (has no administration in time range)  gabapentin (NEURONTIN) capsule 300 mg (has no administration in time range)  azithromycin (ZITHROMAX) tablet 500 mg (has no administration in time range)  amLODipine (NORVASC) tablet 5 mg (has no administration in time range)  atorvastatin (LIPITOR) tablet 20 mg (has no administration in time range)  Ipratropium-Albuterol (COMBIVENT) respimat 1 puff (has no administration in time range)  albuterol (VENTOLIN HFA) 108 (90 Base) MCG/ACT inhaler 2 puff (has no administration in time range)  fesoterodine (TOVIAZ) tablet 4 mg (has no administration in time range)  methylPREDNISolone sodium succinate (SOLU-MEDROL) 125 mg/2 mL injection 125 mg (125 mg Intravenous Given 02/20/19 2032)  albuterol (VENTOLIN HFA) 108 (90 Base) MCG/ACT inhaler 2 puff (2 puffs Inhalation Given 02/20/19 2032)  iohexol (OMNIPAQUE) 350 MG/ML injection 75 mL (75 mLs Intravenous Contrast Given 02/20/19 2126)    Mobility walks Low fall risk   Focused Assessments Pulmonary Assessment Handoff:  Lung sounds: Bilateral Breath Sounds: Rhonchi L Breath Sounds: Rhonchi R Breath Sounds: Rhonchi O2 Device: Nasal Cannula O2 Flow Rate (L/min): 5 L/min      R Recommendations: See Admitting Provider Note  Report given to:   Additional Notes:

## 2019-02-21 NOTE — Discharge Summary (Addendum)
FMTS Attending Discharge Note: Dorris Singh, MD  Pager [phone]  Office [phone] I have seen and examined this patient, reviewed their chart. I have discussed this patient with the resident. I agree with the resident's findings, assessment and care plan. -    [facility] Discharge Summary  Patient name: Tracey Ferrell record number: [id number] Date of birth: Oct 30, 1951 Age: 68 y.o. Gender: female Date of Admission: 02/20/2019  Date of Discharge: 02/25/2019 Admitting Physician: [place] Shirley, DO  Primary Care Provider: Sherene Sires, DO Consultants: None  Indication for Hospitalization: COPD exacerbation  Discharge Diagnoses/Problem List:  Patient Active Problem List   Diagnosis Date Noted  . Ovarian cyst, complex 02/21/2019  . COPD (chronic obstructive pulmonary disease) ([place]) 02/13/2019  . COPD exacerbation ([place]) 04/13/2015  . Depression 04/13/2015  . Fibromyalgia 04/13/2015  . Dyslipidemia 04/13/2015  . HTN (hypertension) 04/13/2015   Disposition: HHPT, DME O2,   Discharge Condition: Improved  Discharge Exam:  General: sitting up in chair receiving neb, NAD with non-toxic appearance HEENT: normocephalic, atraumatic, moist mucous membranes Cardiovascular: regular rate and rhythm without murmurs, rubs, or gallops Lungs: clear to auscultation bilaterally with normal work of breathing on 2 L [place] Abdomen: soft, non-tender, non-distended, normoactive bowel sounds Skin: warm, dry, no rashes or lesions Extremities: warm and well perfused, normal tone, no edema, slow and steady gait with rolling walker Brief Hospital Course:  Nolene Rocks is a 68 y.o. female who presents with COPD exacerbation. Patient was SARSCOV2 negative. Patient oxygen saturation on RA was 86%. She was placed on 5L [place] to maintain adequate saturations. Patient received one dose of IV methylprednisolone and PO azithromycin 500 mg in the ED along with duonebs. She was admitted due  to her acute hypoxemia w/o home O2 use. She was continued on supplamental O2 and scheduled combivent and albuterol. Patient was transitioned to PO azithromycin 250 mg and PO prednisone 40 mg for 5 day courses. Patient continued to improve on this regimen. However, she was unable to be weaned to RA. She required 2L of [place] of O2 to maintain saturation when ambulating.   Issues for Follow Up:  1. Ensure patient makes it to outpatient GYN/Onc for ovarian mass 2. Ambulatory referral to Pulmonology due to COPD exacerbation, needs PFTs, likely needing Pulmonary Rehab 3. Wean oxygen as tolerated- please repeat walk test in 2-4 weeks  4. Recommend A1C as outpatient (mildly elevated fasting BG)  5. Repeat CBC with differential in 2-4 weeks to ensure leukocytosis resolves   Significant Procedures: None  Significant Labs and Imaging:  Recent Labs  Lab 02/20/19 2009 02/21/19 0458 02/23/19 0734  WBC 12.0* 5.6 11.7*  HGB 14.8 14.0 14.0  HCT 46.5* 43.5 44.3  PLT 489* 453* 476*   Recent Labs  Lab 02/20/19 2009 02/21/19 0458 02/23/19 0734  NA 140 141 140  K 3.5 4.4 4.6  CL 101 103 99  CO2 26 28 30   GLUCOSE 119* 166* 109*  BUN 5* 11 23  CREATININE 0.71 0.76 0.70  CALCIUM 9.7 9.4 9.5  ALKPHOS 207* 209*  --   AST 22 19  --   ALT 20 20  --   ALBUMIN 4.2 3.8  --       Results/Tests Pending at Time of Discharge:   Discharge Medications:  Allergies as of 02/25/2019   No Known Allergies     Medication List    STOP taking these medications   Anoro Ellipta 62.5-25 MCG/INH Aepb Generic drug:  umeclidinium-vilanterol  azithromycin 500 MG tablet Commonly known as:  ZITHROMAX   budesonide-formoterol 80-4.5 MCG/ACT inhaler Commonly known as:  SYMBICORT   cephALEXin 500 MG capsule Commonly known as:  KEFLEX   guaifenesin 100 MG/5ML syrup Commonly known as:  ROBITUSSIN   HYDROcodone-acetaminophen 5-325 MG tablet Commonly known as:  NORCO/VICODIN   ibuprofen 200 MG tablet Commonly  known as:  ADVIL   pravastatin 40 MG tablet Commonly known as:  PRAVACHOL   predniSONE 10 MG tablet Commonly known as:  DELTASONE   tiotropium 18 MCG inhalation capsule Commonly known as:  SPIRIVA     TAKE these medications   albuterol 108 (90 Base) MCG/ACT inhaler Commonly known as:  VENTOLIN HFA Inhale 2 puffs into the lungs every 4 (four) hours as needed for wheezing or shortness of breath.   amLODipine 5 MG tablet Commonly known as:  NORVASC Take 1 tablet (5 mg total) by mouth daily.   atorvastatin 20 MG tablet Commonly known as:  LIPITOR Take 20 mg by mouth daily.   benzonatate 100 MG capsule Commonly known as:  TESSALON Take 1 capsule (100 mg total) by mouth 2 (two) times daily as needed for cough.   cholecalciferol 25 MCG (1000 UT) tablet Commonly known as:  VITAMIN D3 Take 1,000 Units by mouth daily.   Fluticasone-Umeclidin-Vilant 100-62.5-25 MCG/INH Aepb Commonly known as:  Trelegy Ellipta Inhale 1 Cartridge into the lungs every morning for 30 days.   gabapentin 300 MG capsule Commonly known as:  NEURONTIN Take 300 mg by mouth at bedtime as needed for pain.   omeprazole 20 MG capsule Commonly known as:  PRILOSEC Take 20 mg by mouth daily.   Toviaz 4 MG Tb24 tablet Generic drug:  fesoterodine Take 4 mg by mouth daily.            Durable Medical Equipment  (From admission, onward)         Start     Ordered   02/24/19 0941  For home use only DME Walker rolling  Once    Question:  Patient needs a walker to treat with the following condition  Answer:  Generalized weakness   02/24/19 0941   02/23/19 0925  For home use only DME oxygen  Once    Question Answer Comment  Mode or (Route) Nasal cannula   Liters per Minute 2   Frequency Continuous (stationary and portable oxygen unit needed)   Oxygen conserving device Yes   Oxygen delivery system Gas      02/23/19 0925          Discharge Instructions: Please refer to Patient Instructions  section of EMR for full details.  Patient was counseled important signs and symptoms that should prompt return to medical care, changes in medications, dietary instructions, activity restrictions, and follow up appointments.   Follow-Up Appointments: Follow-up Information    Sherene Sires, DO Follow up on 03/04/2019.   Specialty:  Family Medicine Why:  8:30AM Contact information: [address] [place] [address] [phone]           Bonnita Hollow, MD 02/25/2019, 11:38 AM PGY-2, [place]

## 2019-02-21 NOTE — Progress Notes (Signed)
FPTS Interim Progress Note  S:Patient sitting comfortably in bed. Says that her breathing is much improved from admission.   O: BP (!) 145/78 (BP Location: Right Arm)   Pulse 66   Temp (!) 97.3 F (36.3 C) (Oral)   Resp 19   Ht 4\' 10"  (1.473 m)   Wt 51.1 kg   SpO2 100%   BMI 23.54 kg/m    Gen: NAD, resting comfortably on [place], pleasant, smiling CV: RRR with no murmurs appreciated Pulm: wheezing and poor air movement throughout, speaks in broken sentances GI: soft, nontender, nondistended MSK: no edema, cyanosis, or clubbing noted Skin: warm, dry Neuro: grossly normal, moves all extremities Psych: Normal affect and thought content  A/P: Plan to start oral prednisone 40 mg for remainder of 5 day course. Plan to continue 250 mg azithromycin for remainder of 5 day course. Please see H&P for more further information regarding plans for the day.   Bonnita Hollow, MD 02/21/2019, 9:13 AM PGY-2, [facility] pager [phone]

## 2019-02-22 DIAGNOSIS — R7309 Other abnormal glucose: Secondary | ICD-10-CM

## 2019-02-22 LAB — GLUCOSE, CAPILLARY
Glucose-Capillary: 110 mg/dL — ABNORMAL HIGH (ref 70–99)
Glucose-Capillary: 174 mg/dL — ABNORMAL HIGH (ref 70–99)
Glucose-Capillary: 172 mg/dL — ABNORMAL HIGH (ref 70–99)
Glucose-Capillary: 107 mg/dL — ABNORMAL HIGH (ref 70–99)

## 2019-02-22 MED ORDER — ALBUTEROL SULFATE (2.5 MG/3ML) 0.083% IN NEBU: 2.5000 mg | INHALATION_SOLUTION | RESPIRATORY_TRACT | Status: DC | PRN

## 2019-02-22 MED ORDER — DM-GUAIFENESIN ER 30-600 MG PO TB12
1.0000 | ORAL_TABLET | Freq: Two times a day (BID) | ORAL | Status: DC | PRN
  Administered 2019-02-22 – 2019-02-25 (×6): 1 via ORAL
  Filled 2019-02-22 (×7): qty 1

## 2019-02-22 NOTE — Progress Notes (Signed)
Family Medicine Teaching Service Daily Progress Note Intern Pager: [phone]  Patient name: Tracey Ferrell record number: [id number] Date of birth: 09/12/1951 Age: 68 y.o. Gender: female  Primary Care Provider: Sherene Sires, DO Consultants: None Code Status: Code status  Pt Overview and Major Events to Date:  Tracey Ferrell is a 68 y.o. female presenting with. PMH is significant for COPD, hypertension, overactive bladder, pelvic mass.   4/24 - admission  Assessment and Plan:  COPD exacerbation O2 requirement as improved since admission, however patient continues to be on 2 L.  Patient on oral azithromycin and prednisone.    Continue azithromycin (4/23-4/27) and prednisone (4/23-4/27) for 5-day course  Nasal cannula WAT  Scheduled Combivent with albuterol as needed  Elevated CBGs Present on admission, likely exacerbated on steroids.  Sliding scale insulin with 3 times daily CBG checks with meals  #Mild thrombocytosis (improving)  leukocytosis (resolved) Afebrile. Platelets down trending from 489 > 453.  WBC 12 > 5.6. -Monitor CBC  #Hypertension Slightly elevated Systolics 536U-440H.   Continue home amlodipine  Monitor BP  Allow outpatient PCP to optimize  Chronic but stable medical conditions  #Cystadenocarcinoma Outpatient referral with Gyn Onc has been placed per the PCP.  #Fibromyalgia -Continue home gabapentin 300 mg nightly  #Dyslipidemia -Continue home atorvastatin  #Overactive bladder -Continue home Toviaz  FEN/GI: Heart healthy diet Prophylaxis: Lovenox  Disposition: med-surg  Subjective:  Overnight, sats 99% on 2L. Pt very anxious, weaned to 1.5 L.  Patient also complained of sore throat pain and cough.  Patient was given Chloraseptic spray, guaifenesin, and Tessalon Perles.  Objective: Temp:  [97 F (36.1 C)-97.3 F (36.3 C)] 97.3 F (36.3 C) (04/23 2347) Pulse Rate:  [66-102] 87 (04/24 0723) Resp:  [19-20] 20 (04/24 0723) BP:  (130-145)/(57-78) 144/67 (04/23 2347) SpO2:  [92 %-100 %] 92 % (04/24 0723) Physical Exam: Gen: NAD, resting comfortably, 2L [place] in place CV: RRR with no murmurs appreciated Pulm: improved, but poor air movement, wheeze throughout, has to stop every few words while speaking to catch breath GI: Normal bowel sounds present. Soft, Nontender, Nondistended. MSK: no edema, cyanosis, or clubbing noted Skin: warm, dry Neuro: grossly normal, moves all extremities Psych: Normal affect and thought content  Laboratory: Recent Labs  Lab 02/20/19 2009 02/21/19 0458  WBC 12.0* 5.6  HGB 14.8 14.0  HCT 46.5* 43.5  PLT 489* 453*   Recent Labs  Lab 02/20/19 2009 02/21/19 0458  NA 140 141  K 3.5 4.4  CL 101 103  CO2 26 28  BUN 5* 11  CREATININE 0.71 0.76  CALCIUM 9.7 9.4  PROT 8.0 7.3  BILITOT 0.7 0.6  ALKPHOS 207* 209*  ALT 20 20  AST 22 19  GLUCOSE 119* 166*      Imaging/Diagnostic Tests: Ct Angio Chest Pe W And/or Wo Contrast  Result Date: 02/20/2019 CLINICAL DATA:  Chest pain with positive D-dimer EXAM: CT ANGIOGRAPHY CHEST WITH CONTRAST TECHNIQUE: Multidetector CT imaging of the chest was performed using the standard protocol during bolus administration of intravenous contrast. Multiplanar CT image reconstructions and MIPs were obtained to evaluate the vascular anatomy. CONTRAST:  20mL OMNIPAQUE IOHEXOL 350 MG/ML SOLN COMPARISON:  None. FINDINGS: Cardiovascular: --Pulmonary arteries: Contrast injection is sufficient to demonstrate satisfactory opacification of the pulmonary arteries to the segmental level, with attenuation of 675 HU at the main pulmonary artery. There is no pulmonary embolus. The main pulmonary artery is within normal limits for size. --Aorta: Limited opacification of the aorta due to bolus  timing optimization for the pulmonary arteries. Conventional 3 vessel aortic branching pattern. The aortic course and caliber are normal. There is minimal aortic atherosclerosis.  --Heart: Normal size. No pericardial effusion. Mediastinum/Nodes: No mediastinal, hilar or axillary lymphadenopathy. The visualized thyroid and thoracic esophageal course are unremarkable. Lungs/Pleura: No pulmonary nodules or masses. No pleural effusion or pneumothorax. No focal airspace consolidation. No focal pleural abnormality. Upper Abdomen: Contrast bolus timing is not optimized for evaluation of the abdominal organs. Within this limitation, the visualized organs of the upper abdomen are normal. Musculoskeletal: No chest wall abnormality. No acute or significant osseous findings. Bilateral breast implants. Review of the MIP images confirms the above findings. IMPRESSION: No pulmonary embolus or other acute thoracic abnormality. Electronically Signed   By: Ulyses Jarred M.D.   On: 02/20/2019 21:49   Dg Chest Port 1 View  Result Date: 02/20/2019 CLINICAL DATA:  Shortness of breath and chest pain EXAM: PORTABLE CHEST 1 VIEW COMPARISON:  02/13/2019 FINDINGS: The heart size and mediastinal contours are within normal limits. Both lungs are clear. The visualized skeletal structures are unremarkable. IMPRESSION: No active disease. Electronically Signed   By: Ulyses Jarred M.D.   On: 02/20/2019 20:50    Bonnita Hollow, MD 02/22/2019, 7:47 AM PGY-2, [place] Intern pager: [phone], text pages welcome

## 2019-02-22 NOTE — Evaluation (Signed)
Occupational Therapy Evaluation Patient Details Name: Tracey Ferrell MRN: [id number] DOB: 1950-12-24 Today's Date: 02/22/2019    History of Present Illness Tracey Ferrell is a 68 y.o. female who presents with COPD exacerbation. Patient was SARSCOV2 negative. Patient oxygen saturation on RA was 86%. She was placed on 5L [place] to maintain adequate saturations.   Clinical Impression   Pt PTA: living in ALF mostly independent with ADL and mobility. Pt currently performing ADL with supervisionA and SOB with exertion. O2 desat to 86% on RA and requires 2L O2 to recover 90% O2. Pt performing ADL functional transfers and mobility in room with RW.  UB ADL with set-upA and LB with supervisionA. Pt tolerating session well. Pt would benefit from OT going over energy conservation techniques. OT to follow acutely.    Follow Up Recommendations  Supervision - Intermittent    Equipment Recommendations  None recommended by OT    Recommendations for Other Services       Precautions / Restrictions Precautions Precautions: Fall Restrictions Weight Bearing Restrictions: No      Mobility Bed Mobility Overal bed mobility: Independent                Transfers Overall transfer level: Needs assistance Equipment used: Rolling walker (2 wheeled) Transfers: Sit to/from Stand Sit to Stand: Min guard         General transfer comment: Pt minguardA for stabilizing at first    Balance Overall balance assessment: Needs assistance Sitting-balance support: No upper extremity supported;Feet supported Sitting balance-Leahy Scale: Fair     Standing balance support: Bilateral upper extremity supported;During functional activity Standing balance-Leahy Scale: Fair Standing balance comment: pt stood at sink for ADL                           ADL either performed or assessed with clinical judgement   ADL Overall ADL's : At baseline                                       General ADL  Comments: Pt performing ADL at baseline - standing at sink, performing own toilet hygiene, LB dressing and LB bathing.     Vision Baseline Vision/History: No visual deficits Patient Visual Report: No change from baseline Vision Assessment?: No apparent visual deficits     Perception     Praxis      Pertinent Vitals/Pain Pain Assessment: No/denies pain     Hand Dominance Right   Extremity/Trunk Assessment Upper Extremity Assessment Upper Extremity Assessment: Defer to OT evaluation   Lower Extremity Assessment Lower Extremity Assessment: Generalized weakness   Cervical / Trunk Assessment Cervical / Trunk Assessment: Normal   Communication Communication Communication: No difficulties   Cognition Arousal/Alertness: Awake/alert Behavior During Therapy: WFL for tasks assessed/performed Overall Cognitive Status: Within Functional Limits for tasks assessed                                     General Comments  O2 desats to 87% on RA and 1L O2 91% and 2L O2 98%     Exercises     Shoulder Instructions      Home Living Family/patient expects to be discharged to:: Assisted living Living Arrangements: Alone Available Help at Discharge: Friend(s);Available PRN/intermittently Type of Home: Apartment Home Access: Level  entry     Home Layout: One level     Bathroom Shower/Tub: Teacher, early years/pre: Standard     Home Equipment: Shower seat;Grab bars - toilet;Grab bars - tub/shower;Walker - 4 wheels          Prior Functioning/Environment Level of Independence: Independent                 OT Problem List: Decreased strength;Decreased activity tolerance;Impaired balance (sitting and/or standing);Decreased safety awareness;Decreased coordination      OT Treatment/Interventions: Self-care/ADL training;Therapeutic exercise;Neuromuscular education;Energy conservation;Therapeutic activities;Patient/family education;Balance training     OT Goals(Current goals can be found in the care plan section) Acute Rehab OT Goals Patient Stated Goal: to go home OT Goal Formulation: With patient Time For Goal Achievement: 03/08/19 Potential to Achieve Goals: Good ADL Goals Pt Will Perform Lower Body Dressing: with modified independence;sit to/from stand Additional ADL Goal #1: Pt will perform ADL functional mobility with modified independence and least restrictive device. Additional ADL Goal #2: Pt will state 3 energy conservation techniques to assist with increasing activity tolerance.  OT Frequency: Min 2X/week   Barriers to D/C:            Co-evaluation              AM-PAC OT "6 Clicks" Daily Activity     Outcome Measure Help from another person eating meals?: None Help from another person taking care of personal grooming?: None Help from another person toileting, which includes using toliet, bedpan, or urinal?: A Little Help from another person bathing (including washing, rinsing, drying)?: A Little Help from another person to put on and taking off regular upper body clothing?: None Help from another person to put on and taking off regular lower body clothing?: A Little 6 Click Score: 21   End of Session Equipment Utilized During Treatment: Gait belt;Rolling walker Nurse Communication: Mobility status  Activity Tolerance: Patient tolerated treatment well;Treatment limited secondary to medical complications (Comment) Patient left: in chair;with call bell/phone within reach  OT Visit Diagnosis: Unsteadiness on feet (R26.81);Muscle weakness (generalized) (M62.81)                Time: 1497-0263 OT Time Calculation (min): 28 min Charges:  OT General Charges $OT Visit: 1 Visit OT Evaluation $OT Eval Moderate Complexity: 1 Mod OT Treatments $Self Care/Home Management : 8-22 mins  Ebony Hail Harold Hedge) Marsa Aris OTR/L Acute Rehabilitation Services Pager: [phone] Office: [place] 02/22/2019, 4:11 PM

## 2019-02-22 NOTE — Evaluation (Signed)
Physical Therapy Evaluation Patient Details Name: Tracey Ferrell MRN: [id number] DOB: 05/21/1951 Today's Date: 02/22/2019   History of Present Illness  Tracey Ferrell is a 68 y.o. female who presents with COPD exacerbation. Patient was SARSCOV2 negative. Patient oxygen saturation on RA was 86%. She was placed on 5L [place] to maintain adequate saturations.  Clinical Impression  Pt admitted with above diagnosis. Pt currently with functional limitations due to the deficits listed below (see PT Problem List). Pt was able to ambualte with RW with good stability overall with device.  Was not steady without device and pt is aware.  Needed O2 to keep sats >88% as well.  Will follow acutely.  Pt will benefit from skilled PT to increase their independence and safety with mobility to allow discharge to the venue listed below.    SATURATION QUALIFICATIONS: (This note is used to comply with regulatory documentation for home oxygen)  Patient Saturations on Room Air at Rest = 85%  Patient Saturations on Room Air while Ambulating = NT as desat on RA at rest  Patient Saturations on 4 Liters of oxygen while Ambulating = 89%  Please briefly explain why patient needs home oxygen:Pt needed 4LO2 to keep sats >90%. Will need home O2.   Follow Up Recommendations Home health PT;Supervision - Intermittent    Equipment Recommendations  Rolling walker with 5" wheels(home O2)    Recommendations for Other Services       Precautions / Restrictions Precautions Precautions: Fall Restrictions Weight Bearing Restrictions: No      Mobility  Bed Mobility Overal bed mobility: Independent                Transfers Overall transfer level: Needs assistance Equipment used: Rolling walker (2 wheeled) Transfers: Sit to/from Stand Sit to Stand: Min guard;Min assist         General transfer comment: Pt needed steadying assist min assist without device in front of her. She needed min guard assist to rW and was more  steady.   Ambulation/Gait Ambulation/Gait assistance: Min guard Gait Distance (Feet): 120 Feet Assistive device: Rolling walker (2 wheeled) Gait Pattern/deviations: Step-through pattern;Decreased stride length   Gait velocity interpretation: <1.31 ft/sec, indicative of household ambulator General Gait Details: Pt was able to ambulate with RW wtih overall good stability needing cues to stay close to RW at times.  Pt desats below 88% on RA and needs 2L at rest and 4L with activity.  Cues to pursed lip breathe as well.    Stairs            Wheelchair Mobility    Modified Rankin (Stroke Patients Only)       Balance Overall balance assessment: Needs assistance Sitting-balance support: No upper extremity supported;Feet supported Sitting balance-Leahy Scale: Fair     Standing balance support: Bilateral upper extremity supported;During functional activity Standing balance-Leahy Scale: Poor Standing balance comment: Pt was able to stand with RW but needed steadying assist for balance dynamically.                              Pertinent Vitals/Pain Pain Assessment: No/denies pain    Home Living Family/patient expects to be discharged to:: Assisted living Living Arrangements: Alone Available Help at Discharge: Friend(s);Available PRN/intermittently Type of Home: Apartment Home Access: Level entry     Home Layout: One level Home Equipment: Shower seat;Grab bars - toilet;Grab bars - tub/shower;Walker - 4 wheels      Prior Function  Level of Independence: Independent               Hand Dominance   Dominant Hand: Right    Extremity/Trunk Assessment   Upper Extremity Assessment Upper Extremity Assessment: Defer to OT evaluation    Lower Extremity Assessment Lower Extremity Assessment: Generalized weakness    Cervical / Trunk Assessment Cervical / Trunk Assessment: Normal  Communication   Communication: No difficulties  Cognition  Arousal/Alertness: Awake/alert Behavior During Therapy: WFL for tasks assessed/performed Overall Cognitive Status: Within Functional Limits for tasks assessed                                        General Comments      Exercises     Assessment/Plan    PT Assessment Patient needs continued PT services  PT Problem List Decreased activity tolerance;Decreased balance;Decreased mobility;Decreased knowledge of use of DME;Decreased safety awareness;Decreased knowledge of precautions;Cardiopulmonary status limiting activity       PT Treatment Interventions DME instruction;Gait training;Functional mobility training;Therapeutic activities;Therapeutic exercise;Balance training;Patient/family education    PT Goals (Current goals can be found in the Care Plan section)  Acute Rehab PT Goals Patient Stated Goal: to go home PT Goal Formulation: With patient Time For Goal Achievement: 03/08/19 Potential to Achieve Goals: Good    Frequency Min 3X/week   Barriers to discharge Decreased caregiver support      Co-evaluation               AM-PAC PT "6 Clicks" Mobility  Outcome Measure Help needed turning from your back to your side while in a flat bed without using bedrails?: None Help needed moving from lying on your back to sitting on the side of a flat bed without using bedrails?: None Help needed moving to and from a bed to a chair (including a wheelchair)?: A Little Help needed standing up from a chair using your arms (e.g., wheelchair or bedside chair)?: A Little Help needed to walk in hospital room?: A Little Help needed climbing 3-5 steps with a railing? : A Little 6 Click Score: 20    End of Session Equipment Utilized During Treatment: Gait belt;Oxygen Activity Tolerance: Patient limited by fatigue Patient left: in chair;with call bell/phone within reach Nurse Communication: Mobility status PT Visit Diagnosis: Unsteadiness on feet (R26.81);Muscle weakness  (generalized) (M62.81)    Time: 0034-9179 PT Time Calculation (min) (ACUTE ONLY): 16 min   Charges:   PT Evaluation $PT Eval Low Complexity: [place] Pager:  [phone]  Office:  [place] 02/22/2019, 3:20 PM

## 2019-02-22 NOTE — Progress Notes (Signed)
SATURATION QUALIFICATIONS: (This note is used to comply with regulatory documentation for home oxygen)  Patient Saturations on Room Air at Rest = 85%  Patient Saturations on Room Air while Ambulating = NT as desat on RA at rest  Patient Saturations on 4 Liters of oxygen while Ambulating = 89%  Please briefly explain why patient needs home oxygen:Pt needed 4LO2 to keep sats >90%. Will need home O2. [place] Pager:  [phone]  Office:  [phone]

## 2019-02-22 NOTE — Progress Notes (Signed)
Patient stable throughout shift, oxygen saturation >95% on 2 liters.  Patient becomes very anxious with attempts to decrease.  She remained 99% once placed on 1.5 liters.

## 2019-02-23 LAB — GLUCOSE, CAPILLARY
Glucose-Capillary: 104 mg/dL — ABNORMAL HIGH (ref 70–99)
Glucose-Capillary: 144 mg/dL — ABNORMAL HIGH (ref 70–99)
Glucose-Capillary: 136 mg/dL — ABNORMAL HIGH (ref 70–99)
Glucose-Capillary: 113 mg/dL — ABNORMAL HIGH (ref 70–99)

## 2019-02-23 LAB — BASIC METABOLIC PANEL
Anion gap: 11 (ref 5–15)
BUN: 23 mg/dL (ref 8–23)
CO2: 30 mmol/L (ref 22–32)
Calcium: 9.5 mg/dL (ref 8.9–10.3)
Chloride: 99 mmol/L (ref 98–111)
Creatinine, Ser: 0.7 mg/dL (ref 0.44–1.00)
GFR calc Af Amer: >60 mL/min (ref >60)
GFR calc non Af Amer: >60 mL/min (ref >60)
Glucose, Bld: 109 mg/dL — ABNORMAL HIGH (ref 70–99)
Potassium: 4.6 mmol/L (ref 3.5–5.1)
Sodium: 140 mmol/L (ref 135–145)

## 2019-02-23 LAB — CBC
HCT: 44.3 % (ref 36.0–46.0)
Hemoglobin: 14 g/dL (ref 12.0–15.0)
MCH: 29.7 pg (ref 26.0–34.0)
MCHC: 31.6 g/dL (ref 30.0–36.0)
MCV: 94.1 fL (ref 80.0–100.0)
Platelets: 476 10*3/uL — ABNORMAL HIGH (ref 150–400)
RBC: 4.71 MIL/uL (ref 3.87–5.11)
RDW: 13.7 % (ref 11.5–15.5)
WBC: 11.7 10*3/uL — ABNORMAL HIGH (ref 4.0–10.5)
nRBC: 0 % (ref 0.0–0.2)

## 2019-02-23 NOTE — Plan of Care (Signed)
Patient continues to progress 

## 2019-02-23 NOTE — Progress Notes (Signed)
Physical Therapy Treatment Patient Details Name: Tracey Ferrell MRN: [id number] DOB: 29-Jan-1951 Today's Date: 02/23/2019    History of Present Illness Tracey Ferrell is a 68 y.o. female who presents with COPD exacerbation. Patient was SARSCOV2 negative. Patient oxygen saturation on RA was 86%. She was placed on 5L [place] to maintain adequate saturations.    PT Comments    Patient with poor balance due to weakness this visit. satting well on 3L with activity. Cont to rec supervision for OOB mobility once home.      Follow Up Recommendations  Home health PT;Supervision - Intermittent     Equipment Recommendations  Rolling walker with 5" wheels    Recommendations for Other Services       Precautions / Restrictions Precautions Precautions: Fall Restrictions Weight Bearing Restrictions: No    Mobility  Bed Mobility Overal bed mobility: Independent                Transfers Overall transfer level: Needs assistance Equipment used: Rolling walker (2 wheeled) Transfers: Sit to/from Stand Sit to Stand: Min guard            Ambulation/Gait Ambulation/Gait assistance: Min guard Gait Distance (Feet): 75 Feet Assistive device: Rolling walker (2 wheeled) Gait Pattern/deviations: Step-through pattern;Decreased stride length Gait velocity: decreased   General Gait Details: 3L today WNL Sats. mild LOB from RW, needs guarding.    Stairs             Wheelchair Mobility    Modified Rankin (Stroke Patients Only)       Balance Overall balance assessment: Needs assistance Sitting-balance support: No upper extremity supported;Feet supported                                        Cognition Arousal/Alertness: Awake/alert Behavior During Therapy: WFL for tasks assessed/performed Overall Cognitive Status: Within Functional Limits for tasks assessed                                        Exercises      General Comments         Pertinent Vitals/Pain Pain Assessment: No/denies pain    Home Living                      Prior Function            PT Goals (current goals can now be found in the care plan section) Acute Rehab PT Goals Patient Stated Goal: to go home PT Goal Formulation: With patient Time For Goal Achievement: 03/08/19 Potential to Achieve Goals: Good Progress towards PT goals: Progressing toward goals    Frequency    Min 3X/week      PT Plan Current plan remains appropriate    Co-evaluation              AM-PAC PT "6 Clicks" Mobility   Outcome Measure  Help needed turning from your back to your side while in a flat bed without using bedrails?: None Help needed moving from lying on your back to sitting on the side of a flat bed without using bedrails?: None Help needed moving to and from a bed to a chair (including a wheelchair)?: A Little Help needed standing up from a chair using your arms (e.g., wheelchair or bedside chair)?:  A Little Help needed to walk in hospital room?: A Little Help needed climbing 3-5 steps with a railing? : A Little 6 Click Score: 20    End of Session Equipment Utilized During Treatment: Gait belt;Oxygen Activity Tolerance: Patient limited by fatigue Patient left: in chair;with call bell/phone within reach Nurse Communication: Mobility status PT Visit Diagnosis: Unsteadiness on feet (R26.81);Muscle weakness (generalized) (M62.81)     Time: 7482-7078 PT Time Calculation (min) (ACUTE ONLY): 20 min  Charges:  $Gait Training: 8-22 mins                     Reinaldo Berber, PT, DPT Acute Rehabilitation Services Pager: [phone] Office: [phone]     Reinaldo Berber 02/23/2019, 11:04 AM

## 2019-02-23 NOTE — Plan of Care (Signed)
Good activity tolerance wo desaturating, maintained on Oxygen 2L via [place],reviewed importance of pacing self during activities.

## 2019-02-23 NOTE — Progress Notes (Signed)
Family Medicine Teaching Service Daily Progress Note Intern Pager: [phone]  Patient name: Tracey Ferrell record number: [id number] Date of birth: [birth year] Age: 68 y.o. Gender: female  Primary Care Provider: Sherene Sires, DO Consultants: None Code Status: Code status  Pt Overview and Major Events to Date:  Tracey Ferrell is a 68 y.o. female presenting with. PMH is significant for COPD, hypertension, overactive bladder, pelvic mass.   4/24 - admission  Assessment and Plan:  COPD exacerbation Pt with better air movement. O2 continues to be on 2 L.  Patient on oral azithromycin and prednisone. PT abulated pt and she needed 4L to maintain sats. Likely will need Home O2   Continue azithromycin (4/23-4/27) and prednisone (4/23-4/27) for 5-day course  Nasal cannula WAT  Scheduled Combivent with albuterol as needed  Add back home controller inhaler  OOB in chair  HHPT ordered  Elevated CBGs Present on admission, likely exacerbated on steroids.  Sliding scale insulin with 3 times daily CBG checks with meals  #Mild thrombocytosis (improving)  leukocytosis (resolved) Afebrile. Platelets down trending from 489 > 453 > 476.  WBC 12 > 5.6 > 11.7. -Monitor CBC  #Hypertension Normotensive  Continue home amlodipine  Monitor BP  Allow outpatient PCP to optimize  Chronic but stable medical conditions  #Cystadenocarcinoma Outpatient referral with Gyn Onc has been placed per the PCP.  #Fibromyalgia -Continue home gabapentin 300 mg nightly  #Dyslipidemia -Continue home atorvastatin  #Overactive bladder -Continue home Toviaz  FEN/GI: Heart healthy diet Prophylaxis: Lovenox  Disposition: med-surg  Subjective:  Pt feels breathing is better, but still feels diffusely weak.   Objective: Temp:  [97.8 F (36.6 C)-97.9 F (36.6 C)] 97.8 F (36.6 C) (04/24 2322) Pulse Rate:  [69-99] 69 (04/24 2322) Resp:  [12-18] 12 (04/24 2322) BP: (115-121)/(67-74) 115/74  (04/24 2322) SpO2:  [93 %-97 %] 93 % (04/25 0724) Physical Exam: Gen: NAD, resting comfortably, 2L [place] in place CV: RRR with no murmurs appreciated Pulm: improved air movement, wheeze throughout, GI: Normal bowel sounds present. Soft, Nontender, Nondistended. MSK: no edema, cyanosis, or clubbing noted Skin: warm, dry Neuro: grossly normal, moves all extremities Psych: Normal affect and thought content  Laboratory: Recent Labs  Lab 02/20/19 2009 02/21/19 0458 02/23/19 0734  WBC 12.0* 5.6 11.7*  HGB 14.8 14.0 14.0  HCT 46.5* 43.5 44.3  PLT 489* 453* 476*   Recent Labs  Lab 02/20/19 2009 02/21/19 0458  NA 140 141  K 3.5 4.4  CL 101 103  CO2 26 28  BUN 5* 11  CREATININE 0.71 0.76  CALCIUM 9.7 9.4  PROT 8.0 7.3  BILITOT 0.7 0.6  ALKPHOS 207* 209*  ALT 20 20  AST 22 19  GLUCOSE 119* 166*      Imaging/Diagnostic Tests: Ct Angio Chest Pe W And/or Wo Contrast  Result Date: 02/20/2019 CLINICAL DATA:  Chest pain with positive D-dimer EXAM: CT ANGIOGRAPHY CHEST WITH CONTRAST TECHNIQUE: Multidetector CT imaging of the chest was performed using the standard protocol during bolus administration of intravenous contrast. Multiplanar CT image reconstructions and MIPs were obtained to evaluate the vascular anatomy. CONTRAST:  61mL OMNIPAQUE IOHEXOL 350 MG/ML SOLN COMPARISON:  None. FINDINGS: Cardiovascular: --Pulmonary arteries: Contrast injection is sufficient to demonstrate satisfactory opacification of the pulmonary arteries to the segmental level, with attenuation of 675 HU at the main pulmonary artery. There is no pulmonary embolus. The main pulmonary artery is within normal limits for size. --Aorta: Limited opacification of the aorta due to bolus timing optimization for  the pulmonary arteries. Conventional 3 vessel aortic branching pattern. The aortic course and caliber are normal. There is minimal aortic atherosclerosis. --Heart: Normal size. No pericardial effusion.  Mediastinum/Nodes: No mediastinal, hilar or axillary lymphadenopathy. The visualized thyroid and thoracic esophageal course are unremarkable. Lungs/Pleura: No pulmonary nodules or masses. No pleural effusion or pneumothorax. No focal airspace consolidation. No focal pleural abnormality. Upper Abdomen: Contrast bolus timing is not optimized for evaluation of the abdominal organs. Within this limitation, the visualized organs of the upper abdomen are normal. Musculoskeletal: No chest wall abnormality. No acute or significant osseous findings. Bilateral breast implants. Review of the MIP images confirms the above findings. IMPRESSION: No pulmonary embolus or other acute thoracic abnormality. Electronically Signed   By: Ulyses Jarred M.D.   On: 02/20/2019 21:49   Dg Chest Port 1 View  Result Date: 02/20/2019 CLINICAL DATA:  Shortness of breath and chest pain EXAM: PORTABLE CHEST 1 VIEW COMPARISON:  02/13/2019 FINDINGS: The heart size and mediastinal contours are within normal limits. Both lungs are clear. The visualized skeletal structures are unremarkable. IMPRESSION: No active disease. Electronically Signed   By: Ulyses Jarred M.D.   On: 02/20/2019 20:50    Bonnita Hollow, MD 02/23/2019, 8:39 AM PGY-2, [place] Intern pager: [phone], text pages welcome

## 2019-02-24 LAB — GLUCOSE, CAPILLARY
Glucose-Capillary: 114 mg/dL — ABNORMAL HIGH (ref 70–99)
Glucose-Capillary: 201 mg/dL — ABNORMAL HIGH (ref 70–99)
Glucose-Capillary: 122 mg/dL — ABNORMAL HIGH (ref 70–99)
Glucose-Capillary: 142 mg/dL — ABNORMAL HIGH (ref 70–99)

## 2019-02-24 MED ORDER — IPRATROPIUM-ALBUTEROL 0.5-2.5 (3) MG/3ML IN SOLN
3.0000 mL | RESPIRATORY_TRACT | Status: DC
  Administered 2019-02-24 – 2019-02-25 (×8): 3 mL via RESPIRATORY_TRACT
  Filled 2019-02-24 (×7): qty 3

## 2019-02-24 NOTE — Progress Notes (Addendum)
Family Medicine Teaching Service Daily Progress Note Intern Pager: [phone]  Patient name: Tracey Ferrell record number: [id number] Date of birth: 1951/06/04 Age: 68 y.o. Gender: female  Primary Care Provider: Sherene Sires, DO Consultants: None Code Status: Full  Pt Overview and Major Events to Date:  4/24: Admit for SOB d/t COPD exacerbation 4/25: [place] on 2 L [place] > 4 L [place]  Assessment and Plan: Maryan Sivak is a 68 y.o. female presenting with. PMH is significant for COPD, hypertension, overactive bladder, pelvic mass.   1.  Acute on chronic hypoxic respiratory failure in the setting of COPD exacerbation: Improving.  Currently tolerating 2 L nasal cannula.  Without wheeze or increased work of breathing.  Will likely need home oxygen on discharge with plans for home health as recommended by PT/OT. - Continue day for 5 for azithromycin and prednisone - Home health PT orders placed - DME order placed for home oxygen, rolling walker at discharge - Continue duo nebs every 6 hours scheduled, and albuterol nebs every 4 hours scheduled - Mucinex DM and Tessalon Perls as needed - Plan to resume home Spiriva and albuterol at discharge  2.  Hypertension: Chronic.  Normotensive. - Continue home amlodipine 5 mg daily  3.  Cystadenocarcinoma: Outpatient referral with Gyn Onc has been placed per the PCP. - Follow-up outpatient  4.  Fibromyalgia: Chronic.  Stable. - Continue home gabapentin 300 mg nightly  5.  Dyslipidemia: Chronic.  On statin for primary prevention. - Continue home Lipitor 20 mg daily  6.  Overactive bladder: Chronic.  Stable. - Continue home to Toviaz 4 mg daily  FEN/GI: Heart healthy diet Prophylaxis: Lovenox  Disposition: Continue to wean to room air. Anticipate d/c with [facility] 4/27.  Subjective:  Patient asking to stay another night. Says she feels weak. Denies chest pain or shortness of breath in oxygen.    Objective: Temp:  [97.6 F (36.4 C)-98 F (36.7 C)]  97.6 F (36.4 C) (04/26 0039) Pulse Rate:  [64-86] 73 (04/26 0340) Resp:  [16-19] 16 (04/26 0340) BP: (119-154)/(55-66) 154/66 (04/26 0039) SpO2:  [85 %-100 %] 95 % (04/26 6237) Physical Exam: General: sitting up in chair receiving neb, NAD with non-toxic appearance HEENT: normocephalic, atraumatic, moist mucous membranes Cardiovascular: regular rate and rhythm without murmurs, rubs, or gallops Lungs: clear to auscultation bilaterally with normal work of breathing on 2 L [place] Abdomen: soft, non-tender, non-distended, normoactive bowel sounds Skin: warm, dry, no rashes or lesions Extremities: warm and well perfused, normal tone, no edema, slow and steady gait with rolling walker  Laboratory: Recent Labs  Lab 02/20/19 2009 02/21/19 0458 02/23/19 0734  WBC 12.0* 5.6 11.7*  HGB 14.8 14.0 14.0  HCT 46.5* 43.5 44.3  PLT 489* 453* 476*   Recent Labs  Lab 02/20/19 2009 02/21/19 0458 02/23/19 0734  NA 140 141 140  K 3.5 4.4 4.6  CL 101 103 99  CO2 26 28 30   BUN 5* 11 23  CREATININE 0.71 0.76 0.70  CALCIUM 9.7 9.4 9.5  PROT 8.0 7.3  --   BILITOT 0.7 0.6  --   ALKPHOS 207* 209*  --   ALT 20 20  --   AST 22 19  --   GLUCOSE 119* 166* 109*   COVID: Negative BNP: 79.8  Imaging/Diagnostic Tests: CT ANGIOGRAPHY CHEST WITH CONTRAST (02/20/2019) IMPRESSION: No pulmonary embolus or other acute thoracic abnormality.  PORTABLE CHEST 1 VIEW (02/20/2019) IMPRESSION: No active disease.  EKG 12-LEAD (02/20/2019) Sinus rhythm 93 bpm,  biatrial enlargemnt, QTC 483, no ST changes ot t-wave abnormailities    Arabi Bing, DO 02/24/2019, 7:50 AM PGY-3, [place] Intern pager: [phone], text pages welcome

## 2019-02-24 NOTE — Plan of Care (Signed)
To;erating decrease in oxygen to 1L via [place], improved exercise tolerance, verbaized understanding of medication use

## 2019-02-24 NOTE — Care Management Important Message (Signed)
Important Message  Patient Details  Name: Tracey Ferrell MRN: [id number] Date of Birth: 11-20-1950   Medicare Important Message Given:  Yes    Gerrianne Scale Kieran Nachtigal, LCSW 02/24/2019, 11:22 AM

## 2019-02-24 NOTE — Progress Notes (Signed)
Daily Nursing Note Received report from [place]. RN. Introduced self to patient who endorses no complaints at the present time. Mobilized in room on RA and noted to have desaturated to mid 80's placed back on 1LPM in 90's. In early afternoon patient complained of congestion --> given mucinex. All other patient needs met throughout shift.  Glucometer: Blood Glucose - 114 -->201 -->122  DC Plan: Discharge to home when medically optimized.

## 2019-02-25 DIAGNOSIS — R739 Hyperglycemia, unspecified: Secondary | ICD-10-CM

## 2019-02-25 LAB — GLUCOSE, CAPILLARY
Glucose-Capillary: 111 mg/dL — ABNORMAL HIGH (ref 70–99)
Glucose-Capillary: 232 mg/dL — ABNORMAL HIGH (ref 70–99)

## 2019-02-25 MED ORDER — BENZONATATE 100 MG PO CAPS: 100.0000 mg | ORAL_CAPSULE | Freq: Two times a day (BID) | ORAL | 0 refills | Status: DC | PRN

## 2019-02-25 MED ORDER — FLUTICASONE-UMECLIDIN-VILANT 100-62.5-25 MCG/INH IN AEPB: 1.0000 | INHALATION_SPRAY | Freq: Every morning | RESPIRATORY_TRACT | 11 refills | Status: AC

## 2019-02-25 MED FILL — TRELEGY ELLIPTA 100-62.5-25: 100-62.5-25 | 30 days supply | Qty: 60 | Fill #0 | Status: TO

## 2019-02-25 MED FILL — BENZONATATE 100 MG CAP: 100 | 10 days supply | Qty: 20 | Fill #0

## 2019-02-25 NOTE — Progress Notes (Signed)
Physical Therapy Treatment Patient Details Name: Tracey Ferrell MRN: [id number] DOB: 07-21-1951 Today's Date: 02/25/2019    History of Present Illness Tracey Ferrell is a 68 y.o. female who presents with COPD exacerbation. Patient was SARSCOV2 negative. Patient oxygen saturation on RA was 86%. She was placed on 5L [place] to maintain adequate saturations.    PT Comments    Pt continues to make good progress with mobility. Pt to return home this PM.    Follow Up Recommendations  Home health PT;Supervision - Intermittent     Equipment Recommendations  Rolling walker with 5" wheels    Recommendations for Other Services       Precautions / Restrictions Precautions Precautions: None Restrictions Weight Bearing Restrictions: No    Mobility  Bed Mobility               General bed mobility comments: Standing on arrival  Transfers Overall transfer level: Modified independent Equipment used: None Transfers: Sit to/from Stand Sit to Stand: Modified independent (Device/Increase time)            Ambulation/Gait Ambulation/Gait assistance: Supervision Gait Distance (Feet): 125 Feet Assistive device: Rolling walker (2 wheeled);None Gait Pattern/deviations: Step-through pattern;Decreased stride length Gait velocity: decr Gait velocity interpretation: 1.31 - 2.62 ft/sec, indicative of limited community ambulator General Gait Details: Pt amb in room without assistive device. In halls using rolling walker. Amb on 2L of O2.    Stairs             Wheelchair Mobility    Modified Rankin (Stroke Patients Only)       Balance Overall balance assessment: Needs assistance Sitting-balance support: No upper extremity supported;Feet supported Sitting balance-Leahy Scale: Fair     Standing balance support: During functional activity;No upper extremity supported Standing balance-Leahy Scale: Fair                              Cognition Arousal/Alertness:  Awake/alert Behavior During Therapy: WFL for tasks assessed/performed Overall Cognitive Status: Within Functional Limits for tasks assessed                                        Exercises      General Comments        Pertinent Vitals/Pain Pain Assessment: No/denies pain    Home Living                      Prior Function            PT Goals (current goals can now be found in the care plan section) Progress towards PT goals: Progressing toward goals    Frequency    Min 3X/week      PT Plan Current plan remains appropriate    Co-evaluation              AM-PAC PT "6 Clicks" Mobility   Outcome Measure  Help needed turning from your back to your side while in a flat bed without using bedrails?: None Help needed moving from lying on your back to sitting on the side of a flat bed without using bedrails?: None Help needed moving to and from a bed to a chair (including a wheelchair)?: None Help needed standing up from a chair using your arms (e.g., wheelchair or bedside chair)?: None Help needed to walk in hospital room?: A  Little Help needed climbing 3-5 steps with a railing? : A Little 6 Click Score: 22    End of Session Equipment Utilized During Treatment: Oxygen Activity Tolerance: Patient tolerated treatment well Patient left: in chair;with call bell/phone within reach Nurse Communication: Other (comment)(question about medicines for home) PT Visit Diagnosis: Unsteadiness on feet (R26.81);Muscle weakness (generalized) (M62.81)     Time: 1206-1220 PT Time Calculation (min) (ACUTE ONLY): 14 min  Charges:  $Gait Training: 8-22 mins                     [place] Pager [phone] Office [place] 02/25/2019, 1:51 PM

## 2019-02-25 NOTE — Progress Notes (Signed)
Occupational Therapy Treatment Patient Details Name: Tracey Ferrell MRN: [id number] DOB: [birth year] Today's Date: 02/25/2019    History of present illness Tracey Ferrell is a 68 y.o. female who presents with COPD exacerbation. Patient was SARSCOV2 negative. Patient oxygen saturation on RA was 86%. She was placed on 5L [place] to maintain adequate saturations.   OT comments  Pt progressing towards established OT goals. Pt performing functional mobility at supervision level. Challenging pt home disability functional mobility and activity tolerance. Pt performing functional mobility in hallway on 1L O2 and staying >91%. At 91%, pt reporting increased fatigue and presents with increased SOB. Elevated to 2L O2 and pt maintaining >95% SpO2. Providing handout on EC and educating pt on techniques during ADLs and IADLs. Continue to recommend dc home and will continue to follow acutely as admitted.    Follow Up Recommendations  Supervision - Intermittent    Equipment Recommendations  None recommended by OT    Recommendations for Other Services      Precautions / Restrictions Precautions Precautions: Fall Restrictions Weight Bearing Restrictions: No       Mobility Bed Mobility               General bed mobility comments: OOB upon arrival  Transfers Overall transfer level: Needs assistance Equipment used: Rolling walker (2 wheeled) Transfers: Sit to/from Stand Sit to Stand: Supervision         General transfer comment: Supervision for safety    Balance Overall balance assessment: Needs assistance Sitting-balance support: No upper extremity supported;Feet supported Sitting balance-Leahy Scale: Fair     Standing balance support: Bilateral upper extremity supported;During functional activity Standing balance-Leahy Scale: Fair                             ADL either performed or assessed with clinical judgement   ADL Overall ADL's : At baseline                                        General ADL Comments: Pt performing ADLs at baseline. Performing functional mobility in hallway to challenge home distance mobility and activity tolerance. Educating pt on [facility] and reviewed EC handout. Pt verablized understanding.     Vision   Vision Assessment?: No apparent visual deficits   Perception     Praxis      Cognition Arousal/Alertness: Awake/alert Behavior During Therapy: WFL for tasks assessed/performed Overall Cognitive Status: Within Functional Limits for tasks assessed                                          Exercises     Shoulder Instructions       General Comments Pt performing functional mobility on 1L and maintaining >91%. Once pt was at 91% on SpO2, pt reporting feeling more fatigues and weak. Elevating to 2L O2 and pt maintaining SpO2 >95%. At end of session, pt with increased coughing and required seated rest break.    Pertinent Vitals/ Pain       Pain Assessment: No/denies pain  Home Living  Prior Functioning/Environment              Frequency  Min 2X/week        Progress Toward Goals  OT Goals(current goals can now be found in the care plan section)  Progress towards OT goals: Progressing toward goals  Acute Rehab OT Goals Patient Stated Goal: to go home OT Goal Formulation: With patient Time For Goal Achievement: 03/08/19 Potential to Achieve Goals: Good ADL Goals Pt Will Perform Lower Body Dressing: with modified independence;sit to/from stand Additional ADL Goal #1: Pt will perform ADL functional mobility with modified independence and least restrictive device. Additional ADL Goal #2: Pt will state 3 energy conservation techniques to assist with increasing activity tolerance.  Plan Discharge plan remains appropriate    Co-evaluation                 AM-PAC OT "6 Clicks" Daily Activity     Outcome Measure   Help from  another person eating meals?: None Help from another person taking care of personal grooming?: None Help from another person toileting, which includes using toliet, bedpan, or urinal?: A Little Help from another person bathing (including washing, rinsing, drying)?: A Little Help from another person to put on and taking off regular upper body clothing?: None Help from another person to put on and taking off regular lower body clothing?: A Little 6 Click Score: 21    End of Session Equipment Utilized During Treatment: Gait belt;Rolling walker  OT Visit Diagnosis: Unsteadiness on feet (R26.81);Muscle weakness (generalized) (M62.81)   Activity Tolerance Patient tolerated treatment well;Treatment limited secondary to medical complications (Comment)   Patient Left in chair;with call bell/phone within reach   Nurse Communication Mobility status        Time: 0741-0800 OT Time Calculation (min): 19 min  Charges: OT General Charges $OT Visit: 1 Visit OT Treatments $Self Care/Home Management : 8-22 mins  [place], OTR/L Acute Rehab Pager: [phone] Office: [place] 02/25/2019, 8:24 AM

## 2019-02-25 NOTE — Care Management (Addendum)
Patient gives permission to talk to to friend Merry Proud. She will need transportation home.  LM for Merry Proud to callback, awaiting response. [place],Jeff Friend [phone]   11:40 Spoke w Merry Proud who states that he will be able to provide transportation home. He will be at hospital around 4:30. He was provided with RN station number to call when he was close to hospital. Bedside RN updated. Zack w [employer] updated so oxygen will be delivered to room.  13:30 Paid $9.90 at [place] for DC meds. They will be provided to patient prior to DC to take home w her.

## 2019-02-25 NOTE — TOC Initial Note (Signed)
Transition of Care [facility]) - Initial/Assessment Note    Patient Details  Name: Tracey Ferrell MRN: [id number] Date of Birth: 21-Jan-1951  [facility] CM/SW Contact:    Carles Collet, RN Phone Number: 02/25/2019, 10:25 AM  Clinical Narrative:             Spoke w patient at the bedside. She states that she lives at home alone. She expresses anxiety about discharging with oxygen.  Spoke w MD and added disciples to her [facility]. Spoke w Tommi Rumps from [place] who is sending expedited PCS order forms I will get filled out, signed, and fax to [facility] so patient will have additional help at home. This could start as soon as this week in additional to [facility] services.  RW and O2 will be delivered to room today.        Expected Discharge Plan: [place] Barriers to Discharge: Continued Medical Work up   Patient Goals and CMS Choice Patient states their goals for this hospitalization and ongoing recovery are:: to go home CMS Medicare.gov Compare Post Acute Care list provided to:: Patient Choice offered to / list presented to : Patient  Expected Discharge Plan and Services Expected Discharge Plan: [place]   Discharge Planning Services: CM Consult Post Acute Care Choice: Home Health, Durable Medical Equipment   Expected Discharge Date: 02/23/19               DME Arranged: [place] Rile rolling, Oxygen DME Agency: [employer] Date DME Agency Contacted: 02/25/19 Time DME Agency Contacted: [phone] Representative spoke with at DME Agency: zack HH Arranged: RN, PT, OT, Nurse's Aide, Social Work [employer] Agency: [place] Date [place]: 02/25/19 Time [place]: [phone] Representative spoke with at [place]: [place] Arrangements/Services                       Activities of Daily Living Home Assistive Devices/Equipment: None ADL Screening (condition at time of admission) Patient's cognitive ability adequate to safely complete  daily activities?: Yes Is the patient deaf or have difficulty hearing?: No Does the patient have difficulty seeing, even when wearing glasses/contacts?: No Does the patient have difficulty concentrating, remembering, or making decisions?: No Patient able to express need for assistance with ADLs?: Yes Does the patient have difficulty dressing or bathing?: No Independently performs ADLs?: Yes (appropriate for developmental age) Does the patient have difficulty walking or climbing stairs?: No Weakness of Legs: None Weakness of Arms/Hands: None  Permission Sought/Granted                  Emotional Assessment              Admission diagnosis:  COPD exacerbation ([place]) [J44.1] Patient Active Problem List   Diagnosis Date Noted  . Ovarian cyst, complex 02/21/2019  . COPD (chronic obstructive pulmonary disease) ([place]) 02/13/2019  . COPD exacerbation ([place]) 04/13/2015  . Depression 04/13/2015  . Fibromyalgia 04/13/2015  . Dyslipidemia 04/13/2015  . HTN (hypertension) 04/13/2015   PCP:  Sherene Sires, DO Pharmacy:   [id number] - [place], [place]. [address] [place] [address] Phone: [phone] Fax: [phone]  [facility] [place], [place] [address] [place] [address] Phone: [phone] Fax: [phone]     Social Determinants of Health (SDOH) Interventions    Readmission  Risk Interventions No flowsheet data found.

## 2019-02-25 NOTE — Progress Notes (Signed)
Family Medicine Teaching Service Daily Progress Note Intern Pager: [phone]  Patient name: [place] record number: [id number] Date of birth: 04-25-1951 Age: 68 y.o. Gender: female  Primary Care Provider: Sherene Sires, DO Consultants: None Code Status: Full  Pt Overview and Major Events to Date:  4/24: Admit for SOB d/t COPD exacerbation 4/25: [place]at on 2 L [place] > 4 L [place]  Assessment and Plan: Tracey Ferrell is a 68 y.o. female presenting with. PMH is significant for COPD, hypertension, overactive bladder, pelvic mass.   1.  Acute on chronic hypoxic respiratory failure in the setting of COPD exacerbation: Stable.  Currently tolerating 1 L [place].  Planning to D/C with home O2.  - Continue day for 5 for azithromycin and prednisone - Home health PT orders placed - DME order placed for home oxygen, rolling walker at discharge - Continue duo nebs every 6 hours scheduled, and albuterol nebs every 4 hours scheduled - Mucinex DM and Tessalon Perls as needed - Plan to resume home Spiriva and albuterol at discharge  2.  Hypertension: Chronic.  Normotensive. - Continue home amlodipine 5 mg daily  Chronic, but stable medical conditions  3.  Cystadenocarcinoma: Outpatient referral with Gyn Onc has been placed per the PCP. - Follow-up outpatient  4.  Fibromyalgia: Chronic.  Stable. - Continue home gabapentin 300 mg nightly  5.  Dyslipidemia: Chronic.  On statin for primary prevention. - Continue home Lipitor 20 mg daily  6.  Overactive bladder: Chronic.  Stable. - Continue home to Toviaz 4 mg daily  FEN/GI: Heart healthy diet Prophylaxis: Lovenox  Disposition:D/C today  Subjective:  No acute events overnight.    Objective: Temp:  [97.6 F (36.4 C)-98.2 F (36.8 C)] 97.6 F (36.4 C) (04/27 0723) Pulse Rate:  [72-103] 80 (04/27 0723) Resp:  [14-18] 16 (04/27 0408) BP: (92-122)/(51-63) 122/57 (04/27 0723) SpO2:  [92 %-100 %] 99 % (04/27 0723) Physical Exam: General:  sitting up in chair receiving neb, NAD with non-toxic appearance HEENT: normocephalic, atraumatic, moist mucous membranes Cardiovascular: regular rate and rhythm without murmurs, rubs, or gallops Lungs: clear to auscultation bilaterally with normal work of breathing on 2 L [place] Abdomen: soft, non-tender, non-distended, normoactive bowel sounds Skin: warm, dry, no rashes or lesions Extremities: warm and well perfused, normal tone, no edema, slow and steady gait with rolling walker  Laboratory: Recent Labs  Lab 02/20/19 2009 02/21/19 0458 02/23/19 0734  WBC 12.0* 5.6 11.7*  HGB 14.8 14.0 14.0  HCT 46.5* 43.5 44.3  PLT 489* 453* 476*   Recent Labs  Lab 02/20/19 2009 02/21/19 0458 02/23/19 0734  NA 140 141 140  K 3.5 4.4 4.6  CL 101 103 99  CO2 26 28 30   BUN 5* 11 23  CREATININE 0.71 0.76 0.70  CALCIUM 9.7 9.4 9.5  PROT 8.0 7.3  --   BILITOT 0.7 0.6  --   ALKPHOS 207* 209*  --   ALT 20 20  --   AST 22 19  --   GLUCOSE 119* 166* 109*   COVID: Negative BNP: 79.8  Imaging/Diagnostic Tests: CT ANGIOGRAPHY CHEST WITH CONTRAST (02/20/2019) IMPRESSION: No pulmonary embolus or other acute thoracic abnormality.  PORTABLE CHEST 1 VIEW (02/20/2019) IMPRESSION: No active disease.  EKG 12-LEAD (02/20/2019) Sinus rhythm 93 bpm, biatrial enlargemnt, QTC 483, no ST changes ot t-wave abnormailities  Bonnita Hollow, MD 02/25/2019, 7:58 AM PGY-2, [place] Intern pager: [phone], text pages welcome

## 2019-02-25 NOTE — Progress Notes (Signed)
SATURATION QUALIFICATIONS: (This note is used to comply with regulatory documentation for home oxygen)  Patient Saturations on Room Air at Rest = 92%  Patient Saturations on Room Air while Ambulating = 88%  Patient Saturations on 2 Liters of oxygen while Ambulating = 95%  Please briefly explain why patient needs home oxygen:  Patient visibly tired on exertion without oxygen. Ambulates well with walker.  Kinnie Scales, RN 02/25/19 10:49 AM

## 2019-02-26 ENCOUNTER — Encounter: Payer: Self-pay | Admitting: Licensed Clinical Social Worker

## 2019-02-26 ENCOUNTER — Telehealth: Payer: Self-pay | Admitting: Oncology

## 2019-02-26 LAB — CA 125: Cancer Antigen (CA) 125: 20.6 U/mL (ref 0.0–38.1)

## 2019-02-26 LAB — CEA: CEA: 1.4 ng/mL (ref 0.0–4.7)

## 2019-02-26 NOTE — Telephone Encounter (Signed)
Called patient and rescheduled her appointment with Dr. Denman George for 03/01/19 at 11 am with 10:30 am arrival.  She asked if transportation can be arranged.  Discussed using an interpreter and she said she speaks [place] as her main language and that she is not able to read or write.  She would like to use an interpreter.

## 2019-02-26 NOTE — Progress Notes (Addendum)
  02/26/2019  Type of Service: General Social Work Consult via phone  Name: Tracey Ferrell MRN: [id number] DOB: 13-Oct-1951  Tracey Ferrell is a 68 y.o. female referred by Dr. Criss Rosales for concerns with transportation to [facility] appointment.  Review of patient's consultants notes from appropriate care team members was performed as part of care coordination referral.   LCSW contacted LCSW at the [facility] for care coordination (transportation and [place] appointment)   LCSW called patient.  Patient reports being very weak and unable to go to appointment at [facility] tomorrow.  LCSW contacted social worker Lauren at [facility] for care coordination to see if someone from her office is able to call patient.    Plan:   1. [place] will contact patient for transportation needs to [place] appointments  2.  LCSW will F/U with patient in 2 to 3 days.  Sherene Sires, DO has been notified of this plan.   Casimer Lanius, LCSW [facility]   [phone] 9:12 AM

## 2019-02-27 ENCOUNTER — Ambulatory Visit: Payer: Medicare Other | Admitting: Gynecologic Oncology

## 2019-02-27 ENCOUNTER — Telehealth: Payer: Self-pay | Admitting: *Deleted

## 2019-02-27 NOTE — Telephone Encounter (Signed)
Patient's friend Merry Proud called and canceled that appt for May 1st for the patient. He stated "she is just now starting to feel better and doesn't want to go back out or to the hospital. She is scared and very reluctant to come in there. She isn't sure why she needs to come." Explained that when she was in the hospital they found a mass in her abdomen, and referred her to Dr. Denman George. He asked "Is there a way to found out if it's cancerous?" Explained that there is no way of knowing until she has surgery and the mass is biopsied. He stated that "let's cancel the appt and I will talk with her. I will try to get her in there in June."

## 2019-02-28 ENCOUNTER — Telehealth: Payer: Self-pay | Admitting: Licensed Clinical Social Worker

## 2019-02-28 NOTE — Telephone Encounter (Signed)
   Phone Outreach Note  02/28/2019 Name: Tracey Ferrell MRN: [id number] DOB: 11-04-1950  Referred by: Sherene Sires, DO Reason for referral : Transportation  An unsuccessful telephone outreach to patient was attempted today to see if she has transportation to appointment with PCP 03/04/19 .  Left voice message to call LCSW  Follow Up Plan:  If no return call is received. LCSW will call again tomorrow.   Casimer Lanius, LCSW [facility]   [phone] 2:34 PM

## 2019-03-01 ENCOUNTER — Ambulatory Visit: Payer: Medicare Other | Admitting: Gynecologic Oncology

## 2019-03-01 NOTE — Telephone Encounter (Addendum)
  Care Coordination  Telephone Outreach Note 03/01/2019 Name: Tracey Ferrell MRN: [id number] DOB: Mar 28, 1951  Interpretor:Yes.   ; Name: Ivin Booty [phone] and Language: [place].  2nd unsuccessful telephone outreach attempt to Ms. Tracey Ferrell today to assess transportation needs for hospital F/U appointment Monday May 5th at 8:30 AM.  Left voice message via interpretor to call [facility] if she needs transportation to this appointment.  Noted: Patient has canceled appointment scheduled at the [facility].  They arranged transportation for these appointments   Plan: LCSW will wait for return call. Dr. Marca Ancona has been notified of the outreach   Felicitas Sine, [place]   [phone] 11:15 AM

## 2019-03-04 ENCOUNTER — Inpatient Hospital Stay: Payer: Medicare Other

## 2019-03-05 ENCOUNTER — Telehealth: Payer: Self-pay | Admitting: Licensed Clinical Social Worker

## 2019-03-05 NOTE — Telephone Encounter (Signed)
   Care Coordination  Telephone Outreach Note 03/05/2019 Name: Tracey Ferrell MRN: [id number] DOB: 30-Jul-1951  Patient missed hospital F/U appointment Monday.  This is the 3rd unsuccessful telephone outreach attempt to Tracey Ferrell.  LCSW left another message.   LCSW called patient's friend listed on the contact in the chart.  Left message to call LCSW.  Plan: LCSW will wait for return call.  Sherene Sires, DO has been notified of this outreach and Tracey Ferrell's plan.   Casimer Lanius, LCSW [facility]   [phone] 2:58 PM

## 2019-03-06 NOTE — Telephone Encounter (Signed)
Care Coordination  Telephone Outreach Note 03/06/2019 Name: Tracey Ferrell MRN: [id number] DOB: 1951-08-24  LCSW received voice message from Ms. Tracey Ferrell' friend Tracey Ferrell. Reports he has seen patient several times and talks to her daily.  She is progressing well since her discharge from the hospital. He plans to keep a close eye on her.  Plan: LCSW will discontinue outreach calls but will gladly be available at any time to provide services to Ms. Tracey Ferrell.  Tracey Sires, DO has been notified of this outreach and Ms. Tracey Ferrell's plan.   Casimer Lanius, [facility]   [phone] 8:09 AM

## 2019-03-26 ENCOUNTER — Telehealth: Payer: Self-pay

## 2019-04-02 NOTE — Telephone Encounter (Signed)
Left message for Mr. Hardin Negus to call the office to schedule Ms Sedberry for an appointment when she is ready to come in.

## 2019-04-16 ENCOUNTER — Telehealth: Payer: Self-pay | Admitting: Gynecologic Oncology

## 2019-04-16 NOTE — Telephone Encounter (Signed)
Could not reach pt to verify telephone visit because the voicemail was full

## 2019-04-17 ENCOUNTER — Telehealth: Payer: Self-pay | Admitting: Oncology

## 2019-04-17 ENCOUNTER — Other Ambulatory Visit: Payer: Self-pay | Admitting: Gynecologic Oncology

## 2019-04-17 ENCOUNTER — Inpatient Hospital Stay: Payer: Medicare Other | Attending: Gynecologic Oncology | Admitting: Gynecologic Oncology

## 2019-04-17 ENCOUNTER — Encounter: Payer: Self-pay | Admitting: Gynecologic Oncology

## 2019-04-17 DIAGNOSIS — R1907 Generalized intra-abdominal and pelvic swelling, mass and lump: Secondary | ICD-10-CM

## 2019-04-17 DIAGNOSIS — N83299 Other ovarian cyst, unspecified side: Secondary | ICD-10-CM

## 2019-04-17 DIAGNOSIS — J449 Chronic obstructive pulmonary disease, unspecified: Secondary | ICD-10-CM

## 2019-04-17 MED ORDER — BENZONATATE 100 MG PO CAPS: 100.0000 mg | ORAL_CAPSULE | Freq: Two times a day (BID) | ORAL | 0 refills | Status: DC | PRN

## 2019-04-17 NOTE — Telephone Encounter (Signed)
Merry Proud called back and said 05/23/19 would be good for surgery.

## 2019-04-17 NOTE — Progress Notes (Signed)
Gynecologic Oncology Telehealth Consult Note: Gyn-Onc  I connected with Tracey Ferrell on 04/17/19 at 10:45 AM EDT by telephone and verified that I am speaking with the correct person using two identifiers.  I discussed the limitations, risks, security and privacy concerns of performing an evaluation and management service by telemedicine and the availability of in-person appointments. I also discussed with the patient that there may be a patient responsible charge related to this service. The patient expressed understanding and agreed to proceed.  Other persons participating in the visit and their role in the encounter: Pearson Forster (friend, advocate).  Patient's location: Home Provider's location: [place]  Chief Complaint:  Chief Complaint  Patient presents with  . pelvic mass    Consult was requested by Dr. Criss Rosales for the evaluation of Tracey Ferrell 68 y.o. female  Assessment/Plan:  Ms. Tracey Ferrell  is a 68 y.o.  year old with a large 20cm cystic ovarian mass.  The mass is highly symptomatic.  Is been there for at least 2 years, it is associated with a normal ovarian cancer tumor marker.  Therefore I have a relatively low suspicion that this is an underlying high-grade ovarian carcinoma.  However given its size and the symptoms associated with it I am recommending a surgical removal.  I think a mini laparotomy with cyst decompression followed by BSO will be a reasonable approach I discussed this with the patient and her friend and they were in agreement.  I discussed surgical risks including  bleeding, infection, damage to internal organs (such as bladder,ureters, bowels), blood clot, reoperation and rehospitalization. Due to her COPD I will keep her overnight for 24 hours.  I discussed with the patient that if malignancy is identified on frozen section we will have to extend the incision and perform a hysterectomy and staging including lymphadenectomy and peritoneal  biopsies.  I discussed the assessment and treatment plan with the patient. The patient was provided with an opportunity to ask questions and all were answered. The patient agreed with the plan and demonstrated an understanding of the instructions.   The patient was advised to call back or see an in-person evaluation if the symptoms worsen or if the condition fails to improve as anticipated.   I provided 40 minutes of face-to-face video visit time during this encounter, and > 50% was spent counseling as documented under my assessment & plan.  HPI: Ms Tracey Ferrell is a 68 year old P0 who is seen in consultation at the request of Dr Criss Rosales for a large pelvic mass.   The patient reports that for several years she has noticed increasing abdominal girth.  She underwent a CT scan of the abdomen and pelvis in 2018 which revealed a 15 cm cystic ovarian mass.  It is unclear at that time what intervention was planned for the cystic mass.  In April 2020 she was admitted to [facility] with symptoms of a COPD exacerbation.  She was successfully treated medically for this however because she developed an episode of emesis a CT scan of the abdomen and pelvis was performed.  This revealed a tiny amount of free pelvic fluid, without evidence of peritoneal thickening or omental caking.  There was an increased size of a 20 cm complex cystic lesion in the central pelvis and lower abdomen.  It appeared previously measured 14.7 x 10.4 x 11 cm.  The lesion showed mild mural wall thickening and nodularity.  It contained a thin peripheral septation.  There was no lymphadenopathy appreciated.  The uterus contained some small fibroids.  After optimizing her COPD she was discharged home.  Prior to discharge Ca1 25 and CEA were drawn on February 25, 2019.  The Ca1 25 was normal at 20.6 and the CEA was normal at 1.4.  She has been doing well at home since discharge.  She denied being prescribed oral steroids for long-term use.   She does not have a history of anticoagulant or antiplatelet therapy use.  Her medical history is most significant for a prior history of tobacco abuse and COPD.  She does not use home oxygen.  She is able to ambulate including up stairs without excessive shortness of breath or chest pain.  She also has hypertension and hyperlipidemia.  Her most recent creatinine demonstrated grossly normal renal function.  She has no history of prior abdominal surgeries.  She has never been pregnant.  She is not clear about her family history including no known history of malignancies.  Current Meds:  Outpatient Encounter Medications as of 04/17/2019  Medication Sig  . albuterol (PROVENTIL HFA;VENTOLIN HFA) 108 (90 BASE) MCG/ACT inhaler Inhale 2 puffs into the lungs every 4 (four) hours as needed for wheezing or shortness of breath.  Marland Kitchen amLODipine (NORVASC) 5 MG tablet Take 1 tablet (5 mg total) by mouth daily.  Marland Kitchen atorvastatin (LIPITOR) 20 MG tablet Take 20 mg by mouth daily.  . benzonatate (TESSALON) 100 MG capsule Take 1 capsule (100 mg total) by mouth 2 (two) times daily as needed for cough.  . benzonatate (TESSALON) 100 MG capsule Take 1 capsule (100 mg total) by mouth 2 (two) times daily as needed for cough.  . cholecalciferol (VITAMIN D3) 25 MCG (1000 UT) tablet Take 1,000 Units by mouth daily.  Marland Kitchen gabapentin (NEURONTIN) 300 MG capsule Take 300 mg by mouth at bedtime as needed for pain.  Marland Kitchen omeprazole (PRILOSEC) 20 MG capsule Take 20 mg by mouth daily.  . TOVIAZ 4 MG TB24 tablet Take 4 mg by mouth daily.   No facility-administered encounter medications on file as of 04/17/2019.     Allergy: No Known Allergies  Social Hx:   Social History   Socioeconomic History  . Marital status: Single    Spouse name: Not on file  . Number of children: Not on file  . Years of education: Not on file  . Highest education level: Not on file  Occupational History  . Not on file  Social Needs  . Financial resource  strain: Not on file  . Food insecurity    Worry: Not on file    Inability: Not on file  . Transportation needs    Medical: Not on file    Non-medical: Not on file  Tobacco Use  . Smoking status: Former Smoker    Packs/day: 0.50    Years: 44.00    Pack years: 22.00    Types: Cigarettes    Quit date: 12/30/2014    Years since quitting: 4.2  . Smokeless tobacco: Never Used  Substance and Sexual Activity  . Alcohol use: No    Comment: former  . Drug use: No  . Sexual activity: Not on file  Lifestyle  . Physical activity    Days per week: Not on file    Minutes per session: Not on file  . Stress: Not on file  Relationships  . Social Herbalist on phone: Not on file    Gets together: Not on file  Attends religious service: Not on file    Active member of club or organization: Not on file    Attends meetings of clubs or organizations: Not on file    Relationship status: Not on file  . Intimate partner violence    Fear of current or ex partner: Not on file    Emotionally abused: Not on file    Physically abused: Not on file    Forced sexual activity: Not on file  Other Topics Concern  . Not on file  Social History Narrative  . Not on file    Past Surgical Hx:  Past Surgical History:  Procedure Laterality Date  . COSMETIC SURGERY      Past Medical Hx:  Past Medical History:  Diagnosis Date  . Acute respiratory failure with hypoxia ([place]) 04/13/2015  . Asthma   . Back pain   . Borderline diabetic   . Bronchitis, acute 04/13/2015  . COPD (chronic obstructive pulmonary disease) ([place])   . Depression   . Hyperlipidemia   . Hypertension   . LOC (loss of consciousness) ([place]) 04/14/2015  . Polycythemia 04/13/2015  . Shortness of breath dyspnea     Past Gynecological History:  See HPI No LMP recorded. Patient is postmenopausal.  Family Hx: History reviewed. No pertinent family history.  Review of Systems:  Constitutional  Feels well,    ENT Normal  appearing ears and nares bilaterally Skin/Breast  No rash, sores, jaundice, itching, dryness Cardiovascular  No chest pain, shortness of breath, or edema  Pulmonary  No cough or wheeze.  Gastro Intestinal  No nausea, vomitting, or diarrhoea. No bright red blood per rectum, no abdominal pain, change in bowel movement, or constipation. + mass Genito Urinary  No urgency, dysuria, + frequency Musculo Skeletal  No myalgia, arthralgia, joint swelling or pain  Neurologic  No weakness, numbness, change in gait,  Psychology  No depression, anxiety, insomnia.   Vitals:  televisit therefore not available  Physical Exam: Due to televisit these were deferred however when she stands I can see her protruberant abdomen.   Thereasa Solo, MD  04/17/2019, 2:14 PM

## 2019-04-17 NOTE — Telephone Encounter (Signed)
Left a message for Tracey Ferrell with surgery dates of 05/02/19 or 05/23/19.  Requested a return call.

## 2019-04-17 NOTE — Patient Instructions (Signed)
Dr Denman George recommends ex lap (minilap), BSO, possible hysterectomy, possible staging. This will be scheduled for July, 2020.

## 2019-05-17 ENCOUNTER — Telehealth: Payer: Self-pay | Admitting: *Deleted

## 2019-05-17 NOTE — Telephone Encounter (Signed)
Merry Proud called back and we scheduled an appt for 7/20 to meet with Melissa APP for pre-op

## 2019-05-17 NOTE — Telephone Encounter (Signed)
Called and left Merry Proud a message to call the office back. Need to schedule a pre-op appt with Melissa APP

## 2019-05-20 ENCOUNTER — Other Ambulatory Visit (HOSPITAL_COMMUNITY): Payer: Medicare Other

## 2019-05-20 ENCOUNTER — Ambulatory Visit: Payer: Medicare Other | Admitting: Gynecologic Oncology

## 2019-05-20 ENCOUNTER — Inpatient Hospital Stay (HOSPITAL_COMMUNITY): Admission: RE | Admit: 2019-05-20 | Payer: Medicare Other | Source: Ambulatory Visit

## 2019-05-20 ENCOUNTER — Encounter (HOSPITAL_COMMUNITY): Payer: Self-pay

## 2019-05-20 ENCOUNTER — Telehealth: Payer: Self-pay | Admitting: *Deleted

## 2019-05-20 NOTE — Progress Notes (Signed)
The following are in epic: EKG 02/20/2019 CXR 02/20/2019

## 2019-05-20 NOTE — Telephone Encounter (Signed)
Late entry----called and left Merry Proud a message that the patient could take the bus to her appts on 7/20, but would need to either uber or lyft home. Also left a message that we were trying to arrange transportation."

## 2019-05-20 NOTE — Telephone Encounter (Signed)
Attempted to reach the patient regarding her appts for today. Patient no showed for appts. Unable to leave the patient a message due voice mail being full. Called and left Merry Proud a message per Melissa APP "If we don't hear from someone by 4:30 pm today we will have to cancel her surgery on Thursday."

## 2019-05-20 NOTE — Patient Instructions (Signed)
DUE TO COVID-19 ONLY ONE VISITOR IS ALLOWED IN THE HOSPITAL AT THIS TIME   COVID SWAB TESTING MUST BE COMPLETED ON: May 20, 2019 ???                (Must self quarantine after testing. Follow instructions on handout.)   Your procedure is scheduled on: Thursday, May 23, 2019   Surgery Time: 12:30PM-2:30PM   Report to [facility] Main  Entrance    Report to admitting at 10:30 AM   Call this number if you have problems the morning of surgery [phone]   Day before surgery: Light diet the day before (no gas producing foods)   Do not eat food:After Midnight.   May have liquids until 9:30AM day of surgery   CLEAR LIQUID DIET  Foods Allowed                                                                     Foods Excluded  Water, Black Coffee and tea, regular and decaf                             liquids that you cannot  Plain Jell-O in any flavor                                             see through such as: Fruit ices (not with fruit pulp)                                     milk, soups, orange juice  Iced Popsicles                                    All solid food Carbonated beverages, regular and diet                                    Cranberry, grape and apple juices Sports drinks like Gatorade Lightly seasoned clear broth or consume(fat free) Sugar, honey syrup  Sample Menu Breakfast                                Lunch                                     Supper Cranberry juice                    Beef broth                            Chicken broth Jell-O  Grape juice                           Apple juice Coffee or tea                        Jell-O                                      Popsicle                                                Coffee or tea                        Coffee or tea    Brush your teeth the morning of surgery.   Do NOT smoke after Midnight   Take these medicines the morning of surgery with A SIP OF  WATER: Amlodipine, Atorvastatin, Omeprazole, Toviaz   Bring Inhaler day of surgery                               You may not have any metal on your body including hair pins, jewelry, and body piercings             Do not wear make-up, lotions, powders, perfumes/cologne, or deodorant             Do not wear nail polish.  Do not shave  48 hours prior to surgery.                 Do not bring valuables to the hospital. [name].   Contacts, dentures or bridgework may not be worn into surgery.   Bring small overnight bag day of surgery.    Special Instructions: Bring a copy of your healthcare power of attorney and living will documents         the day of surgery if you haven't scanned them in before.              Please read over the following fact sheets you were given:  [facility] - Preparing for Surgery Before surgery, you can play an important role.  Because skin is not sterile, your skin needs to be as free of germs as possible.  You can reduce the number of germs on your skin by washing with CHG (chlorahexidine gluconate) soap before surgery.  CHG is an antiseptic cleaner which kills germs and bonds with the skin to continue killing germs even after washing. Please DO NOT use if you have an allergy to CHG or antibacterial soaps.  If your skin becomes reddened/irritated stop using the CHG and inform your nurse when you arrive at Short Stay. Do not shave (including legs and underarms) for at least 48 hours prior to the first CHG shower.  You may shave your face/neck.  Please follow these instructions carefully:  1.  Shower with CHG Soap the night before surgery and the  morning of surgery.  2.  If you choose to wash your hair, wash your hair  first as usual with your normal  shampoo.  3.  After you shampoo, rinse your hair and body thoroughly to remove the shampoo.                             4.  Use CHG as you would any other liquid soap.   You can apply chg directly to the skin and wash.  Gently with a scrungie or clean washcloth.  5.  Apply the CHG Soap to your body ONLY FROM THE NECK DOWN.   Do   not use on face/ open                           Wound or open sores. Avoid contact with eyes, ears mouth and   genitals (private parts).                       Wash face,  Genitals (private parts) with your normal soap.             6.  Wash thoroughly, paying special attention to the area where your    surgery  will be performed.  7.  Thoroughly rinse your body with warm water from the neck down.  8.  DO NOT shower/wash with your normal soap after using and rinsing off the CHG Soap.                9.  Pat yourself dry with a clean towel.            10.  Wear clean pajamas.            11.  Place clean sheets on your bed the night of your first shower and do not  sleep with pets. Day of Surgery : Do not apply any lotions/deodorants the morning of surgery.  Please wear clean clothes to the hospital/surgery center.  FAILURE TO FOLLOW THESE INSTRUCTIONS MAY RESULT IN THE CANCELLATION OF YOUR SURGERY  PATIENT SIGNATURE_________________________________  NURSE SIGNATURE__________________________________  ________________________________________________________________________   Tracey Ferrell  An incentive spirometer is a tool that can help keep your lungs clear and active. This tool measures how well you are filling your lungs with each breath. Taking long deep breaths may help reverse or decrease the chance of developing breathing (pulmonary) problems (especially infection) following:  A long period of time when you are unable to move or be active. BEFORE THE PROCEDURE   If the spirometer includes an indicator to show your best effort, your nurse or respiratory therapist will set it to a desired goal.  If possible, sit up straight or lean slightly forward. Try not to slouch.  Hold the incentive spirometer in an upright  position. INSTRUCTIONS FOR USE  1. Sit on the edge of your bed if possible, or sit up as far as you can in bed or on a chair. 2. Hold the incentive spirometer in an upright position. 3. Breathe out normally. 4. Place the mouthpiece in your mouth and seal your lips tightly around it. 5. Breathe in slowly and as deeply as possible, raising the piston or the ball toward the top of the column. 6. Hold your breath for 3-5 seconds or for as long as possible. Allow the piston or ball to fall to the bottom of the column. 7. Remove the mouthpiece from your mouth and breathe out normally. 8. Rest for a few seconds  and repeat Steps 1 through 7 at least 10 times every 1-2 hours when you are awake. Take your time and take a few normal breaths between deep breaths. 9. The spirometer may include an indicator to show your best effort. Use the indicator as a goal to work toward during each repetition. 10. After each set of 10 deep breaths, practice coughing to be sure your lungs are clear. If you have an incision (the cut made at the time of surgery), support your incision when coughing by placing a pillow or rolled up towels firmly against it. Once you are able to get out of bed, walk around indoors and cough well. You may stop using the incentive spirometer when instructed by your caregiver.  RISKS AND COMPLICATIONS  Take your time so you do not get dizzy or light-headed.  If you are in pain, you may need to take or ask for pain medication before doing incentive spirometry. It is harder to take a deep breath if you are having pain. AFTER USE  Rest and breathe slowly and easily.  It can be helpful to keep track of a log of your progress. Your caregiver can provide you with a simple table to help with this. If you are using the spirometer at home, follow these instructions: [place] IF:   You are having difficultly using the spirometer.  You have trouble using the spirometer as often as  instructed.  Your pain medication is not giving enough relief while using the spirometer.  You develop fever of 100.5 F (38.1 C) or higher. SEEK IMMEDIATE MEDICAL CARE IF:   You cough up bloody sputum that had not been present before.  You develop fever of 102 F (38.9 C) or greater.  You develop worsening pain at or near the incision site. MAKE SURE YOU:   Understand these instructions.  Will watch your condition.  Will get help right away if you are not doing well or get worse. Document Released: 02/27/2007 Document Revised: 01/09/2012 Document Reviewed: 04/30/2007 Exit[employer] Patient Information 2014 [employer], [place].   ________________________________________________________________________  WHAT IS A BLOOD TRANSFUSION? Blood Transfusion Information  A transfusion is the replacement of blood or some of its parts. Blood is made up of multiple cells which provide different functions.  Red blood cells carry oxygen and are used for blood loss replacement.  White blood cells fight against infection.  Platelets control bleeding.  Plasma helps clot blood.  Other blood products are available for specialized needs, such as hemophilia or other clotting disorders. BEFORE THE TRANSFUSION  Who gives blood for transfusions?   Healthy volunteers who are fully evaluated to make sure their blood is safe. This is blood bank blood. Transfusion therapy is the safest it has ever been in the practice of medicine. Before blood is taken from a donor, a complete history is taken to make sure that person has no history of diseases nor engages in risky social behavior (examples are intravenous drug use or sexual activity with multiple partners). The donor's travel history is screened to minimize risk of transmitting infections, such as malaria. The donated blood is tested for signs of infectious diseases, such as HIV and hepatitis. The blood is then tested to be sure it is compatible with you in  order to minimize the chance of a transfusion reaction. If you or a relative donates blood, this is often done in anticipation of surgery and is not appropriate for emergency situations. It takes many days to process the  donated blood. RISKS AND COMPLICATIONS Although transfusion therapy is very safe and saves many lives, the main dangers of transfusion include:   Getting an infectious disease.  Developing a transfusion reaction. This is an allergic reaction to something in the blood you were given. Every precaution is taken to prevent this. The decision to have a blood transfusion has been considered carefully by your caregiver before blood is given. Blood is not given unless the benefits outweigh the risks. AFTER THE TRANSFUSION  Right after receiving a blood transfusion, you will usually feel much better and more energetic. This is especially true if your red blood cells have gotten low (anemic). The transfusion raises the level of the red blood cells which carry oxygen, and this usually causes an energy increase.  The nurse administering the transfusion will monitor you carefully for complications. HOME CARE INSTRUCTIONS  No special instructions are needed after a transfusion. You may find your energy is better. Speak with your caregiver about any limitations on activity for underlying diseases you may have. SEEK MEDICAL CARE IF:   Your condition is not improving after your transfusion.  You develop redness or irritation at the intravenous (IV) site. SEEK IMMEDIATE MEDICAL CARE IF:  Any of the following symptoms occur over the next 12 hours:  Shaking chills.  You have a temperature by mouth above 102 F (38.9 C), not controlled by medicine.  Chest, back, or muscle pain.  People around you feel you are not acting correctly or are confused.  Shortness of breath or difficulty breathing.  Dizziness and fainting.  You get a rash or develop hives.  You have a decrease in urine  output.  Your urine turns a dark color or changes to pink, red, or brown. Any of the following symptoms occur over the next 10 days:  You have a temperature by mouth above 102 F (38.9 C), not controlled by medicine.  Shortness of breath.  Weakness after normal activity.  The white part of the eye turns yellow (jaundice).  You have a decrease in the amount of urine or are urinating less often.  Your urine turns a dark color or changes to pink, red, or brown. Document Released: 10/14/2000 Document Revised: 01/09/2012 Document Reviewed: 06/02/2008 [facility] Patient Information 2014 [place], [place].  _______________________________________________________________________

## 2019-05-21 ENCOUNTER — Telehealth: Payer: Self-pay | Admitting: *Deleted

## 2019-05-21 ENCOUNTER — Inpatient Hospital Stay (HOSPITAL_COMMUNITY)
Admission: RE | Admit: 2019-05-21 | Discharge: 2019-05-21 | Disposition: A | Discharge status: Home / Self Care | Payer: Medicare Other | Source: Ambulatory Visit

## 2019-05-21 HISTORY — DX: Gastro-esophageal reflux disease without esophagitis: K21.9

## 2019-05-21 HISTORY — DX: Other noninflammatory disorders of ovary, fallopian tube and broad ligament: N83.8

## 2019-05-21 HISTORY — DX: Fibromyalgia: M79.7

## 2019-05-21 HISTORY — DX: Overactive bladder: N32.81

## 2019-05-21 NOTE — Telephone Encounter (Signed)
Attempted to call the patient, no answer and unable to leave voicemail. Patient's mailbox was full. Called and spoke with Merry Proud, he stated that he called yesterday morning and left a message. We never received the message. Merry Proud stated that "I got a message from her yesterday saying she wanted to cancel her appts and surgery. She stated that she was dealing with the bad experience she had from her hospital stay. So I tried to call her back. She started not answering my calls, so I went to home. She wouldn't answer the door for me. She basically shut me out also. I finally got her to answer me and see me. She told me that she thought she was going to pass away soon anyway's, so she wasn't worried about this." Explained that we would cancel her appts and surgery. Explained for either the patient or him to call in the future if she starts having issues.

## 2019-05-21 NOTE — Progress Notes (Signed)
This RN [facility] with [facility]. Per Sharyn Lull, their office is trying one last time to get in touch with patient before they cancel her surgery on Thursday. RN informed Sharyn Lull that patient is scheduled for a pre-op apt today at [place] asks if patient can have covid test today , will results be back by thursday?. RN advised her that if patient can be swabbed before 12 noon , covid results should be back before surgery day . Sharyn Lull verbalized understanding.

## 2019-05-23 ENCOUNTER — Inpatient Hospital Stay (HOSPITAL_COMMUNITY): Admission: RE | Admit: 2019-05-23 | Payer: Medicare Other | Source: Home / Self Care | Admitting: Gynecologic Oncology

## 2019-05-23 ENCOUNTER — Encounter (HOSPITAL_COMMUNITY): Admission: RE | Payer: Self-pay | Source: Home / Self Care

## 2019-05-23 SURGERY — SALPINGO-OOPHORECTOMY, OPEN
Anesthesia: General | Laterality: Bilateral

## 2019-06-12 ENCOUNTER — Ambulatory Visit: Payer: Medicare Other | Admitting: Gynecologic Oncology

## 2020-10-07 ENCOUNTER — Encounter (HOSPITAL_COMMUNITY): Payer: Self-pay | Admitting: Psychiatry

## 2020-10-07 ENCOUNTER — Ambulatory Visit (INDEPENDENT_AMBULATORY_CARE_PROVIDER_SITE_OTHER): Payer: Medicare Other | Admitting: Psychiatry

## 2020-10-07 ENCOUNTER — Other Ambulatory Visit: Payer: Self-pay

## 2020-10-07 ENCOUNTER — Ambulatory Visit (HOSPITAL_COMMUNITY)
Admission: EM | Admit: 2020-10-07 | Discharge: 2020-10-07 | Discharge status: Absent without leave | Payer: Medicaid Other

## 2020-10-07 VITALS — BP 187/84 | HR 107 | Ht <= 58 in | Wt 117.0 lb

## 2020-10-07 DIAGNOSIS — F331 Major depressive disorder, recurrent, moderate: Secondary | ICD-10-CM | POA: Diagnosis not present

## 2020-10-07 NOTE — Progress Notes (Signed)
Psychiatric Initial Adult Assessment   Patient Identification: Tracey Ferrell MRN:  [id number] Date of Evaluation:  10/07/2020 Referral Source: Addison Naegeli, MD Chief Complaint:  " I know my time is getting short.  I am just tired and depressed" Chief Complaint    New Patient (Initial Visit)     Visit Diagnosis:    ICD-10-CM   1. Moderate episode of recurrent major depressive disorder (HCC)  F33.1     History of Present Illness: 69 year old female seen today for initial psychiatric evaluation.  She was referred to outpatient psychiatry by Addison Naegeli, MD.  She has a psychiatric history of depression.  Patient is currently not prescribed psychiatric medications however she has tried Celexa, gabapentin, and trazodone in the past.  Today she is well-groomed, pleasant, cooperative, engaged in conversation, and maintained eye contact.  She describes her mood as depressed.  Provider attempted to conduct a PHQ-9 and GAD-7 however patient noted that she did not understand how to answer the questions.  Provider attempted to explain how to answer questions and informed patient that the screenings could be read to her. She however noted that she just felt depressed.  She endorsed anhedonia, insomnia (noting that she sleeps 2 hours nightly), feelings of worthlessness, hopelessness, decreased energy, and recurrent thoughts of death.  She informed provider that she knows that her time is short and reports that she would be okay if she died.  She denies wanting to hurt herself today and denies SI/HI/VH or paranoia.  Patient was seen with one of her friends who noted that he is concerned because she no longer cares about her health.  He informed Probation officer that after she had a bad experience at [facility] she discontinued her medications for over a year.  Patient reported she now only takes vitamin D and her albuterol inhaler.  She noted that in 2020 she was diagnosed with COPD.  She informed Probation officer that while in  the hospital she felt that the nurse and provider discriminated against her and did not communicate effectively with her.  She noted that she was given medications that she believes exacerbated her COPD symptoms such as coughing and difficulty breathing.  Patient later noted that while in the hospital providers found that she had an ovarian cyst.  She was scheduled to receive surgery however noted that she canceled it because she was distrustful of the facility staff.  Patients abdomen today is extremely distended.  She notes that at times she has pain in her abdomen however reports that she exercises to reduce her pain.  Patient stated that it is hard for people like me, it is better to play dumb.  She informed provider that she does not know how to read or write and notes that she requires assistance from others.  She stated that she has been recently discriminated against and is tired of people looking at her differently.  Patient informed provider that she has been in [place] for 51 years.  She noted that she left her [place] at the age of 40.  She noted that during her childhood she suffered many traumas.  She stated anything that could happen that did not happen.  Patient did not want to elaborate on her trauma and provider endorsed understanding.  She denies flashbacks, avoidant behaviors, or nightmares.  She states that at times she thinks about it however noted that it does not define her.  Patient informed provider that to cope with her anxiety, depression, and past trauma  she prays to God.  She noted that she is a [place] and believes that He kept her mind and heart safe.  Patient also noted that at times she feels like she also visions of what is to come in the future.  Provider recommended starting an antidepressant to help manage symptoms of depression however patient noted that at this time she did not want to start medications.  Writer also recommended providing medications to help patient sleep  however she noted that she does not want any medication.  Provider suggested that patient meet with therapist about trauma, anxiety, and depression.  At this time patient noted that she did not want a therapist.  She informed provider that she would follow-up if needed.  Provider recommended that patient be seen at a different facility to assess her ovarian cyst and abdominal distention.  Provider gave patient name of her prior OB/GYN (that she reported liked).  Provider also gave patient a name and number of another OB/GYN for further evaluation.  Patient was grateful and noted that she may follow-up.  No other concerns noted at this time.  Associated Signs/Symptoms: Depression Symptoms:  anhedonia, insomnia, fatigue, feelings of worthlessness/guilt, hopelessness, recurrent thoughts of death, loss of energy/fatigue, (Hypo) Manic Symptoms:  Denies Anxiety Symptoms:  Denies Psychotic Symptoms:  Denies PTSD Symptoms: Had a traumatic exposure:  Patient noted that she was a victum of trauma during her childhood. she stated anything that could happen did happen. Patient did not elaborate on trauma.   Past Psychiatric History: Depression Previous Psychotropic Medications: Has tried celexa, trazodone, and gapapentin  Substance Abuse History in the last 12 months:  No.  Consequences of Substance Abuse: NA  Past Medical History:  Past Medical History:  Diagnosis Date  . Acute respiratory failure with hypoxia ([place]) 04/13/2015  . Asthma   . Back pain   . Borderline diabetic   . Bronchitis, acute 04/13/2015  . COPD (chronic obstructive pulmonary disease) ([place])   . Depression   . Fibromyalgia   . GERD (gastroesophageal reflux disease)   . Hyperlipidemia   . Hypertension   . LOC (loss of consciousness) ([place]) 04/14/2015  . OAB (overactive bladder)   . Ovarian mass   . Polycythemia 04/13/2015  . Shortness of breath dyspnea     Past Surgical History:  Procedure Laterality Date  . COSMETIC  SURGERY      Family Psychiatric History: Unknown   Family History: History reviewed. No pertinent family history.  Social History:   Social History   Socioeconomic History  . Marital status: Single    Spouse name: Not on file  . Number of children: Not on file  . Years of education: Not on file  . Highest education level: Not on file  Occupational History  . Not on file  Tobacco Use  . Smoking status: Former Smoker    Packs/day: 0.50    Years: 44.00    Pack years: 22.00    Types: Cigarettes    Quit date: 12/30/2014    Years since quitting: 5.7  . Smokeless tobacco: Never Used  Substance and Sexual Activity  . Alcohol use: No    Comment: former  . Drug use: No  . Sexual activity: Not on file  Other Topics Concern  . Not on file  Social History Narrative  . Not on file   Social Determinants of Health   Financial Resource Strain:   . Difficulty of Paying Living Expenses: Not on file  Food Insecurity:   .  Worried About Charity fundraiser in the Last Year: Not on file  . Ran Out of Food in the Last Year: Not on file  Transportation Needs:   . Lack of Transportation (Medical): Not on file  . Lack of Transportation (Non-Medical): Not on file  Physical Activity:   . Days of Exercise per Week: Not on file  . Minutes of Exercise per Session: Not on file  Stress:   . Feeling of Stress : Not on file  Social Connections:   . Frequency of Communication with Friends and Family: Not on file  . Frequency of Social Gatherings with Friends and Family: Not on file  . Attends Religious Services: Not on file  . Active Member of Clubs or Organizations: Not on file  . Attends Archivist Meetings: Not on file  . Marital Status: Not on file    Additional Social History: Patient resides in [place].  She is single.  She currently is unemployed.  She denies tobacco, alcohol, or illegal drug use  Allergies:  No Known Allergies  Metabolic Disorder Labs: Lab Results   Component Value Date   HGBA1C 6.8 (H) 04/13/2015   MPG 148 04/13/2015   No results found for: PROLACTIN Lab Results  Component Value Date   CHOL 278 (H) 01/28/2010   TRIG 51 02/13/2019   HDL 68 01/28/2010   CHOLHDL 4.1 Ratio 01/28/2010   VLDL 17 01/28/2010   LDLCALC 193 (H) 01/28/2010   Lab Results  Component Value Date   TSH 0.231 (L) 04/13/2015    Therapeutic Level Labs: No results found for: LITHIUM No results found for: CBMZ No results found for: VALPROATE  Current Medications: Current Outpatient Medications  Medication Sig Dispense Refill  . albuterol (PROVENTIL HFA;VENTOLIN HFA) 108 (90 BASE) MCG/ACT inhaler Inhale 2 puffs into the lungs every 4 (four) hours as needed for wheezing or shortness of breath. (Patient not taking: Reported on 10/07/2020) 1 Inhaler 5  . amLODipine (NORVASC) 5 MG tablet Take 1 tablet (5 mg total) by mouth daily. (Patient not taking: Reported on 10/07/2020) 30 tablet 3  . atorvastatin (LIPITOR) 20 MG tablet Take 20 mg by mouth daily. (Patient not taking: Reported on 10/07/2020)    . benzonatate (TESSALON) 100 MG capsule Take 1 capsule (100 mg total) by mouth 2 (two) times daily as needed for cough. (Patient not taking: Reported on 10/07/2020) 20 capsule 0  . benzonatate (TESSALON) 100 MG capsule Take 1 capsule (100 mg total) by mouth 2 (two) times daily as needed for cough. (Patient not taking: Reported on 10/07/2020) 20 capsule 0  . cholecalciferol (VITAMIN D3) 25 MCG (1000 UT) tablet Take 1,000 Units by mouth daily. (Patient not taking: Reported on 10/07/2020)    . gabapentin (NEURONTIN) 300 MG capsule Take 300 mg by mouth at bedtime as needed for pain. (Patient not taking: Reported on 10/07/2020)    . omeprazole (PRILOSEC) 20 MG capsule Take 20 mg by mouth daily. (Patient not taking: Reported on 10/07/2020)    . TOVIAZ 4 MG TB24 tablet Take 4 mg by mouth daily. (Patient not taking: Reported on 10/07/2020)     No current facility-administered medications  for this visit.    Musculoskeletal: Strength & Muscle Tone: within normal limits Gait & Station: normal Patient leans: N/A  Psychiatric Specialty Exam: Review of Systems  Blood pressure (!) 187/84, pulse (!) 107, height 4\' 10"  (1.473 m), weight 117 lb (53.1 kg), SpO2 98 %.Body mass index is 24.45 kg/m.  General  Appearance: Well Groomed  Eye Contact:  Good  Speech:  Clear and Coherent and Normal Rate  Volume:  Normal  Mood:  Depressed  Affect:  Appropriate and Congruent  Thought Process:  Coherent, Goal Directed and Linear  Orientation:  Full (Time, Place, and Person)  Thought Content:  WDL and Logical  Suicidal Thoughts:  No  Homicidal Thoughts:  No  Memory:  Immediate;   Good Recent;   Good Remote;   Good  Judgement:  Fair  Insight:  Fair  Psychomotor Activity:  Normal  Concentration:  Concentration: Good and Attention Span: Good  Recall:  Good  Fund of Knowledge:Good  Language: Good  Akathisia:  No  Handed:  Right  AIMS (if indicated):  Not done  Assets:  Communication Skills Desire for Improvement Financial Resources/Insurance Housing Social Support  ADL's:  Intact  Cognition: WNL  Sleep:  Poor   Screenings:   Assessment and Plan: Patient endorses symptoms of depression and SI.  Today she denies wanting to hurt herself however notes that she would be okay if she dies.  She does not want to start new medications.  She informed provider that she will follow-up as needed  Follow-up as needed   Salley Slaughter, NP 12/8/20215:08 PM

## 2020-10-26 ENCOUNTER — Emergency Department (HOSPITAL_COMMUNITY)
Admission: EM | Admit: 2020-10-26 | Discharge: 2020-10-27 | Disposition: A | Discharge status: Home / Self Care | Payer: Medicare Other | Attending: Emergency Medicine | Admitting: Emergency Medicine

## 2020-10-26 ENCOUNTER — Other Ambulatory Visit: Payer: Self-pay

## 2020-10-26 DIAGNOSIS — I10 Essential (primary) hypertension: Secondary | ICD-10-CM | POA: Diagnosis not present

## 2020-10-26 DIAGNOSIS — Z87891 Personal history of nicotine dependence: Secondary | ICD-10-CM | POA: Insufficient documentation

## 2020-10-26 DIAGNOSIS — J45909 Unspecified asthma, uncomplicated: Secondary | ICD-10-CM | POA: Diagnosis not present

## 2020-10-26 DIAGNOSIS — J441 Chronic obstructive pulmonary disease with (acute) exacerbation: Secondary | ICD-10-CM | POA: Insufficient documentation

## 2020-10-26 DIAGNOSIS — Z79899 Other long term (current) drug therapy: Secondary | ICD-10-CM | POA: Insufficient documentation

## 2020-10-26 DIAGNOSIS — R1900 Intra-abdominal and pelvic swelling, mass and lump, unspecified site: Secondary | ICD-10-CM | POA: Insufficient documentation

## 2020-10-26 DIAGNOSIS — R109 Unspecified abdominal pain: Secondary | ICD-10-CM | POA: Diagnosis present

## 2020-10-26 NOTE — ED Triage Notes (Addendum)
Pt presents to ED BIB [facility]. Pt c/o abd pain. Pt had scheduled surgery to get mass/cyst removed on ovary. Pt did not go to surgery and is complaining of worsening pain.   EMS [facility] -  [phone]

## 2020-10-27 ENCOUNTER — Emergency Department (HOSPITAL_COMMUNITY): Payer: Medicare Other

## 2020-10-27 DIAGNOSIS — R1900 Intra-abdominal and pelvic swelling, mass and lump, unspecified site: Secondary | ICD-10-CM | POA: Diagnosis not present

## 2020-10-27 LAB — COMPREHENSIVE METABOLIC PANEL
ALT: 16 U/L (ref 0–44)
AST: 20 U/L (ref 15–41)
Albumin: 4.1 g/dL (ref 3.5–5.0)
Alkaline Phosphatase: 84 U/L (ref 38–126)
Anion gap: 9 (ref 5–15)
BUN: 9 mg/dL (ref 8–23)
CO2: 27 mmol/L (ref 22–32)
Calcium: 9.4 mg/dL (ref 8.9–10.3)
Chloride: 103 mmol/L (ref 98–111)
Creatinine, Ser: 0.58 mg/dL (ref 0.44–1.00)
GFR, Estimated: >60 mL/min (ref >60)
Glucose, Bld: 102 mg/dL — ABNORMAL HIGH (ref 70–99)
Potassium: 3.5 mmol/L (ref 3.5–5.1)
Sodium: 139 mmol/L (ref 135–145)
Total Bilirubin: 0.7 mg/dL (ref 0.3–1.2)
Total Protein: 7.2 g/dL (ref 6.5–8.1)

## 2020-10-27 LAB — URINALYSIS, ROUTINE W REFLEX MICROSCOPIC
Bilirubin Urine: NEGATIVE
Glucose, UA: NEGATIVE mg/dL
Hgb urine dipstick: NEGATIVE
Ketones, ur: NEGATIVE mg/dL
Leukocytes,Ua: NEGATIVE
Nitrite: NEGATIVE
Protein, ur: NEGATIVE mg/dL
Specific Gravity, Urine: >1.046 — ABNORMAL HIGH (ref 1.005–1.030)
pH: 7 (ref 5.0–8.0)

## 2020-10-27 LAB — CBC WITH DIFFERENTIAL/PLATELET
Abs Immature Granulocytes: 0.01 10*3/uL (ref 0.00–0.07)
Basophils Absolute: 0.1 10*3/uL (ref 0.0–0.1)
Basophils Relative: 1 %
Eosinophils Absolute: 0.1 10*3/uL (ref 0.0–0.5)
Eosinophils Relative: 2 %
HCT: 40.7 % (ref 36.0–46.0)
Hemoglobin: 12.7 g/dL (ref 12.0–15.0)
Immature Granulocytes: 0 %
Lymphocytes Relative: 40 %
Lymphs Abs: 2.5 10*3/uL (ref 0.7–4.0)
MCH: 29.8 pg (ref 26.0–34.0)
MCHC: 31.2 g/dL (ref 30.0–36.0)
MCV: 95.5 fL (ref 80.0–100.0)
Monocytes Absolute: 0.3 10*3/uL (ref 0.1–1.0)
Monocytes Relative: 6 %
Neutro Abs: 3.1 10*3/uL (ref 1.7–7.7)
Neutrophils Relative %: 51 %
Platelets: 321 10*3/uL (ref 150–400)
RBC: 4.26 MIL/uL (ref 3.87–5.11)
RDW: 13.1 % (ref 11.5–15.5)
WBC: 6.1 10*3/uL (ref 4.0–10.5)
nRBC: 0 % (ref 0.0–0.2)

## 2020-10-27 MED ORDER — IOHEXOL 300 MG/ML  SOLN
100.0000 mL | Freq: Once | INTRAMUSCULAR | Status: AC | PRN
  Administered 2020-10-27: 100 mL via INTRAVENOUS

## 2020-10-27 MED ORDER — DOCUSATE SODIUM 100 MG PO CAPS: 100.0000 mg | ORAL_CAPSULE | Freq: Two times a day (BID) | ORAL | 0 refills | Status: DC

## 2020-10-27 MED ORDER — OXYCODONE HCL 5 MG PO TABS: 5.0000 mg | ORAL_TABLET | Freq: Four times a day (QID) | ORAL | 0 refills | Status: DC | PRN

## 2020-10-27 MED ORDER — FENTANYL CITRATE (PF) 100 MCG/2ML IJ SOLN
50.0000 ug | Freq: Once | INTRAMUSCULAR | Status: AC
  Administered 2020-10-27: 50 ug via INTRAVENOUS
  Filled 2020-10-27: qty 2

## 2020-10-27 MED ORDER — ONDANSETRON HCL 4 MG/2ML IJ SOLN
4.0000 mg | Freq: Once | INTRAMUSCULAR | Status: AC
  Administered 2020-10-27: 4 mg via INTRAVENOUS
  Filled 2020-10-27: qty 2

## 2020-10-27 NOTE — ED Provider Notes (Signed)
[facility] EMERGENCY DEPARTMENT Provider Note   CSN: [id number] Arrival date & time: 10/26/20  2259     History Chief Complaint  Patient presents with  . Abdominal Pain    Tracey Ferrell is a 69 y.o. female.  Patient with history of COPD, known pelvic mass, likely ovarian in origin --presents the emergency department for abdominal swelling and pain.  Patient was admitted for COPD exacerbation in 01/2019.  Subsequently she had evaluation by Dr. Denman George and surgery was recommended.  This was scheduled however patient counseled this in July 2020.  It appears that the patient has been lost to follow-up since that time.  She presents today for evaluation of a deep throbbing pain in her abdomen.  Her abdomen is very tight and firm.  She states worsening pressure in the epigastrium.  She is able to eat and drink but this causes the pressure in the upper part of the abdomen to be worse.  No nausea, vomiting, diarrhea.  No urinary symptoms.        Past Medical History:  Diagnosis Date  . Acute respiratory failure with hypoxia ([place]) 04/13/2015  . Asthma   . Back pain   . Borderline diabetic   . Bronchitis, acute 04/13/2015  . COPD (chronic obstructive pulmonary disease) ([place])   . Depression   . Fibromyalgia   . GERD (gastroesophageal reflux disease)   . Hyperlipidemia   . Hypertension   . LOC (loss of consciousness) ([place]) 04/14/2015  . OAB (overactive bladder)   . Ovarian mass   . Polycythemia 04/13/2015  . Shortness of breath dyspnea     Patient Active Problem List   Diagnosis Date Noted  . Ovarian cyst, complex 02/21/2019  . COPD (chronic obstructive pulmonary disease) ([place]) 02/13/2019  . COPD exacerbation ([place]) 04/13/2015  . Depression 04/13/2015  . Fibromyalgia 04/13/2015  . Dyslipidemia 04/13/2015  . HTN (hypertension) 04/13/2015    Past Surgical History:  Procedure Laterality Date  . COSMETIC SURGERY       OB History   No obstetric history on  file.     No family history on file.  Social History   Tobacco Use  . Smoking status: Former Smoker    Packs/day: 0.50    Years: 44.00    Pack years: 22.00    Types: Cigarettes    Quit date: 12/30/2014    Years since quitting: 5.8  . Smokeless tobacco: Never Used  Substance Use Topics  . Alcohol use: No    Comment: former  . Drug use: No    Home Medications Prior to Admission medications   Medication Sig Start Date End Date Taking? Authorizing Provider  albuterol (PROVENTIL HFA;VENTOLIN HFA) 108 (90 BASE) MCG/ACT inhaler Inhale 2 puffs into the lungs every 4 (four) hours as needed for wheezing or shortness of breath. Patient not taking: Reported on 10/07/2020 04/15/15   Rai, Vernelle Emerald, MD  amLODipine (NORVASC) 5 MG tablet Take 1 tablet (5 mg total) by mouth daily. Patient not taking: Reported on 10/07/2020 04/15/15   Rai, Vernelle Emerald, MD  atorvastatin (LIPITOR) 20 MG tablet Take 20 mg by mouth daily. Patient not taking: Reported on 10/07/2020 02/03/19   [provider]  benzonatate (TESSALON) 100 MG capsule Take 1 capsule (100 mg total) by mouth 2 (two) times daily as needed for cough. Patient not taking: Reported on 10/07/2020 02/25/19   Bonnita Hollow, MD  benzonatate (TESSALON) 100 MG capsule Take 1 capsule (100 mg total) by  mouth 2 (two) times daily as needed for cough. Patient not taking: Reported on 10/07/2020 04/17/19   Everitt Amber, MD  cholecalciferol (VITAMIN D3) 25 MCG (1000 UT) tablet Take 1,000 Units by mouth daily. Patient not taking: Reported on 10/07/2020    [provider]  gabapentin (NEURONTIN) 300 MG capsule Take 300 mg by mouth at bedtime as needed for pain. Patient not taking: Reported on 10/07/2020 10/22/18   [provider]  omeprazole (PRILOSEC) 20 MG capsule Take 20 mg by mouth daily. Patient not taking: Reported on 10/07/2020    [provider]  TOVIAZ 4 MG TB24 tablet Take 4 mg by mouth daily. Patient not taking: Reported on  10/07/2020 10/22/18   [provider]    Allergies    Patient has no known allergies.  Review of Systems   Review of Systems  Constitutional: Positive for appetite change. Negative for fever.  HENT: Negative for rhinorrhea and sore throat.   Eyes: Negative for redness.  Respiratory: Negative for cough.   Cardiovascular: Negative for chest pain.  Gastrointestinal: Positive for abdominal distention and abdominal pain. Negative for diarrhea, nausea and vomiting.  Genitourinary: Negative for dysuria, frequency, hematuria and urgency.  Musculoskeletal: Negative for myalgias.  Skin: Negative for rash.  Neurological: Negative for headaches.    Physical Exam Updated Vital Signs BP (!) 159/86   Pulse 60   Temp 98 F (36.7 C) (Oral)   Resp 16   SpO2 99%   Physical Exam Vitals and nursing note reviewed.  Constitutional:      General: She is not in acute distress.    Appearance: She is well-developed.  HENT:     Head: Normocephalic and atraumatic.     Right Ear: External ear normal.     Left Ear: External ear normal.     Nose: Nose normal.  Eyes:     Conjunctiva/sclera: Conjunctivae normal.  Cardiovascular:     Rate and Rhythm: Normal rate and regular rhythm.     Heart sounds: No murmur heard.   Pulmonary:     Effort: No respiratory distress.     Breath sounds: No wheezing, rhonchi or rales.  Abdominal:     General: Abdomen is protuberant.     Palpations: Abdomen is soft.     Tenderness: There is generalized abdominal tenderness (mild). There is no guarding or rebound.  Musculoskeletal:     Cervical back: Normal range of motion and neck supple.     Right lower leg: No edema.     Left lower leg: No edema.  Skin:    General: Skin is warm and dry.     Findings: No rash.  Neurological:     General: No focal deficit present.     Mental Status: She is alert. Mental status is at baseline.     Motor: No weakness.  Psychiatric:        Mood and Affect: Mood normal.      ED Results / Procedures / Treatments   Labs (all labs ordered are listed, but only abnormal results are displayed) Labs Reviewed  COMPREHENSIVE METABOLIC PANEL - Abnormal; Notable for the following components:      Result Value   Glucose, Bld 102 (*)    All other components within normal limits  URINALYSIS, ROUTINE W REFLEX MICROSCOPIC - Abnormal; Notable for the following components:   Color, Urine STRAW (*)    Specific Gravity, Urine >1.046 (*)    All other components within normal limits  CBC WITH DIFFERENTIAL/PLATELET    EKG None  Radiology CT ABDOMEN PELVIS W CONTRAST  Result Date: 10/27/2020 CLINICAL DATA:  Abdominal distension.  Known ovarian mass EXAM: CT ABDOMEN AND PELVIS WITH CONTRAST TECHNIQUE: Multidetector CT imaging of the abdomen and pelvis was performed using the standard protocol following bolus administration of intravenous contrast. CONTRAST:  OMNIPAQUE IOHEXOL 300 MG/ML  SOLN COMPARISON:  02/13/2019 FINDINGS: Lower chest: Lung bases are clear. No effusions. Heart is normal size. Hepatobiliary: No focal hepatic abnormality. Gallbladder unremarkable. Pancreas: No focal abnormality or ductal dilatation. Spleen: No focal abnormality.  Normal size. Adrenals/Urinary Tract: No adrenal abnormality. No focal renal abnormality. No stones or hydronephrosis. Urinary bladder is unremarkable. Stomach/Bowel: Stomach, large and small bowel grossly unremarkable. Mild gaseous distention of the transverse colon, possibly related to ileus or mass effect from the large cystic mass. Vascular/Lymphatic: Aortic atherosclerosis. No evidence of aneurysm or adenopathy. Reproductive: Large mass extending from the pelvis into the upper abdomen measures 28 x 28 x 18 cm compared with 20 x 12 x 17 cm previously. 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 Other: Small amount of free fluid in the cul-de-sac, slightly increased since prior study. Musculoskeletal:  No acute bony abnormality. 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 This has enlarged significantly since prior study, currently measuring 28 x 28 x 18 cm. Small amount of free fluid in the cul-de-sac of the pelvis, slightly increased since prior study. Aortic atherosclerosis. Electronically Signed   By: Charlett Nose M.D.   On: 10/27/2020 10:01    Procedures Procedures (including critical care time)  Medications Ordered in ED Medications  fentaNYL (SUBLIMAZE) injection 50 mcg (50 mcg Intravenous Given 10/27/20 0839)  ondansetron (ZOFRAN) injection 4 mg (4 mg Intravenous Given 10/27/20 0839)  iohexol (OMNIPAQUE) 300 MG/ML solution 100 mL (100 mLs Intravenous Contrast Given 10/27/20 0954)    ED Course  I have reviewed the triage vital signs and the nursing notes.  Pertinent labs & imaging results that were available during my care of the patient were reviewed by me and considered in my medical decision making (see chart for details).  Patient seen and examined.  Labs and CT imaging ordered.  Vital signs reviewed and are as follows: BP (!) 159/86   Pulse 60   Temp 98 F (36.7 C) (Oral)   Resp 16   SpO2 99%    9:43 AM Labs reviewed and look okay. Went to check on patient -- looks like she is in CT.   10:43 AM Reviewed CT results with patient. Will touch base with GYN/oncology.   1:01 PM I was able to speak with Dr. Andrey Farmer earlier. She will be happy to follow-up with patient in clinic.  Patient encouraged to call for an appointment.  Discussed all results and follow-up with patient at bedside.  Questions answered.  She is comfortable with discharged home at this time.  Encouraged return to the emergency department with worsening uncontrolled pain, vomiting, new symptoms or other concerns.  Home with #8 5mg  oxycodone for extreme discomfort as well as Colace to  prevent constipation.    MDM Rules/Calculators/A&P                          Patient with gradually enlarging pelvic mass first noted several years ago.  Size today noted to 28 cm x 28  cm x 18 cm.  Patient's symptoms consist of discomfort as well as pressure in the epigastrium with eating and drinking.  Her labs are reassuring.  She appears well-hydrated on exam.  No indications for emergent admission today, however patient is strongly encouraged to follow-up with her gynecologist/oncologist to have this further evaluated.  Discussed with patient that her symptoms are likely going to continue to become worse and may need emergency surgery in the future if she does not have this addressed.  She is in agreement with plan.   Final Clinical Impression(s) / ED Diagnoses Final diagnoses:  Pelvic mass    Rx / DC Orders ED Discharge Orders         Ordered    oxyCODONE (OXY IR/ROXICODONE) 5 MG immediate release tablet  Every 6 hours PRN        10/27/20 1155    docusate sodium (COLACE) 100 MG capsule  Every 12 hours        10/27/20 1155           Carlisle Cater, PA-C 10/27/20 1304    Horton, Barbette Hair, MD 10/27/20 1541

## 2020-10-27 NOTE — ED Notes (Signed)
Patient transported to CT 

## 2020-10-27 NOTE — Discharge Instructions (Signed)
Please read and follow all provided instructions.  Your diagnoses today include:  1. Pelvic mass     Tests performed today include: Blood cell counts (white, red, and platelets) Electrolytes  Kidney function test Urine test to check for infection CT of your abdomen and pelvis - shows enlarging and worsening mass in the pelvis causing your abdominal swelling Vital signs. See below for your results today.   Medications prescribed:  Oxycodone - narcotic pain medication  DO NOT drive or perform any activities that require you to be awake and alert because this medicine can make you drowsy.   Colace - stool softener  This medication can be found over-the-counter.   Continue colace for 2 weeks after your stools return to normal to prevent constipation.   Take any prescribed medications only as directed.  Home care instructions:  Follow any educational materials contained in this packet.  BE VERY CAREFUL not to take multiple medicines containing Tylenol (also called acetaminophen). Doing so can lead to an overdose which can damage your liver and cause liver failure and possibly death.   Follow-up instructions: Please follow-up with your primary care provider in the next 3 days for further evaluation of your symptoms.   Return instructions:  Please return to the Emergency Department if you experience worsening symptoms.  Please return if you have any other emergent concerns.  Additional Information:  Your vital signs today were: BP (!) 170/92   Pulse 62   Temp 98 F (36.7 C) (Oral)   Resp 13   SpO2 97%  If your blood pressure (BP) was elevated above 135/85 this visit, please have this repeated by your doctor within one month. --------------

## 2020-12-10 ENCOUNTER — Emergency Department (HOSPITAL_COMMUNITY): Payer: Medicare Other

## 2020-12-10 ENCOUNTER — Other Ambulatory Visit: Payer: Self-pay

## 2020-12-10 ENCOUNTER — Encounter (HOSPITAL_COMMUNITY): Payer: Self-pay | Admitting: Emergency Medicine

## 2020-12-10 ENCOUNTER — Emergency Department (HOSPITAL_COMMUNITY)
Admission: EM | Admit: 2020-12-10 | Discharge: 2020-12-11 | Disposition: A | Discharge status: Home / Self Care | Payer: Medicare Other | Attending: Emergency Medicine | Admitting: Emergency Medicine

## 2020-12-10 DIAGNOSIS — R079 Chest pain, unspecified: Secondary | ICD-10-CM | POA: Insufficient documentation

## 2020-12-10 DIAGNOSIS — I10 Essential (primary) hypertension: Secondary | ICD-10-CM | POA: Insufficient documentation

## 2020-12-10 DIAGNOSIS — J449 Chronic obstructive pulmonary disease, unspecified: Secondary | ICD-10-CM | POA: Diagnosis not present

## 2020-12-10 DIAGNOSIS — J45909 Unspecified asthma, uncomplicated: Secondary | ICD-10-CM | POA: Insufficient documentation

## 2020-12-10 DIAGNOSIS — N838 Other noninflammatory disorders of ovary, fallopian tube and broad ligament: Secondary | ICD-10-CM | POA: Insufficient documentation

## 2020-12-10 DIAGNOSIS — Z87891 Personal history of nicotine dependence: Secondary | ICD-10-CM | POA: Diagnosis not present

## 2020-12-10 DIAGNOSIS — R140 Abdominal distension (gaseous): Secondary | ICD-10-CM | POA: Diagnosis present

## 2020-12-10 LAB — CBC
HCT: 39.2 % (ref 36.0–46.0)
Hemoglobin: 12.3 g/dL (ref 12.0–15.0)
MCH: 29.6 pg (ref 26.0–34.0)
MCHC: 31.4 g/dL (ref 30.0–36.0)
MCV: 94.2 fL (ref 80.0–100.0)
Platelets: 332 10*3/uL (ref 150–400)
RBC: 4.16 MIL/uL (ref 3.87–5.11)
RDW: 12.7 % (ref 11.5–15.5)
WBC: 6.8 10*3/uL (ref 4.0–10.5)
nRBC: 0 % (ref 0.0–0.2)

## 2020-12-10 LAB — BASIC METABOLIC PANEL
Anion gap: 10 (ref 5–15)
BUN: 12 mg/dL (ref 8–23)
CO2: 25 mmol/L (ref 22–32)
Calcium: 9.2 mg/dL (ref 8.9–10.3)
Chloride: 104 mmol/L (ref 98–111)
Creatinine, Ser: 0.77 mg/dL (ref 0.44–1.00)
GFR, Estimated: >60 mL/min (ref >60)
Glucose, Bld: 104 mg/dL — ABNORMAL HIGH (ref 70–99)
Potassium: 3.5 mmol/L (ref 3.5–5.1)
Sodium: 139 mmol/L (ref 135–145)

## 2020-12-10 LAB — TROPONIN I (HIGH SENSITIVITY)
Troponin I (High Sensitivity): 8 ng/L (ref <18)
Troponin I (High Sensitivity): 7 ng/L (ref <18)

## 2020-12-10 MED ORDER — ALUM & MAG HYDROXIDE-SIMETH 200-200-20 MG/5ML PO SUSP
30.0000 mL | Freq: Once | ORAL | Status: AC
  Administered 2020-12-11: 30 mL via ORAL
  Filled 2020-12-10: qty 30

## 2020-12-10 NOTE — ED Provider Notes (Signed)
[facility] Emergency Department Provider Note MRN:  [id number]  Arrival date & time: 12/11/20     Chief Complaint   Chest Pain   History of Present Illness   Tracey Ferrell is a 70 y.o. year-old female with a history of COPD presenting to the ED with chief complaint of chest pain.  Pain located in the xiphoid process area. Explains she has been having worsening distention and discomfort to her abdomen for several months. 2 months ago she was diagnosed with a large ovarian mass. She has not followed up with anybody yet. She is endorsing nausea, burning pain in the chest and abdomen, unintentional weight loss. No issues with bowel movements. No fever. Symptoms are constant, moderate, progressively worsening. Worse with meals.  Review of Systems  A complete 10 system review of systems was obtained and all systems are negative except as noted in the HPI and PMH.   Patient's Health History    Past Medical History:  Diagnosis Date  . Acute respiratory failure with hypoxia ([employer]) 04/13/2015  . Asthma   . Back pain   . Borderline diabetic   . Bronchitis, acute 04/13/2015  . COPD (chronic obstructive pulmonary disease) ([place])   . Depression   . Fibromyalgia   . GERD (gastroesophageal reflux disease)   . Hyperlipidemia   . Hypertension   . LOC (loss of consciousness) ([place]) 04/14/2015  . OAB (overactive bladder)   . Ovarian mass   . Polycythemia 04/13/2015  . Shortness of breath dyspnea     Past Surgical History:  Procedure Laterality Date  . COSMETIC SURGERY      No family history on file.  Social History   Socioeconomic History  . Marital status: Single    Spouse name: Not on file  . Number of children: Not on file  . Years of education: Not on file  . Highest education level: Not on file  Occupational History  . Not on file  Tobacco Use  . Smoking status: Former Smoker    Packs/day: 0.50    Years: 44.00    Pack years: 22.00    Types: Cigarettes     Quit date: 12/30/2014    Years since quitting: 5.9  . Smokeless tobacco: Never Used  Substance and Sexual Activity  . Alcohol use: No    Comment: former  . Drug use: No  . Sexual activity: Not on file  Other Topics Concern  . Not on file  Social History Narrative  . Not on file   Social Determinants of Health   Financial Resource Strain: Not on file  Food Insecurity: Not on file  Transportation Needs: Not on file  Physical Activity: Not on file  Stress: Not on file  Social Connections: Not on file  Intimate Partner Violence: Not on file     Physical Exam   Vitals:   12/11/20 0100 12/11/20 0130  BP: 94/68 (!) 139/59  Pulse: (!) 57 62  Resp: 16 16  Temp:    SpO2: 99% 94%    CONSTITUTIONAL: Chronically ill-appearing, NAD NEURO:  Alert and oriented x 3, no focal deficits EYES:  eyes equal and reactive ENT/NECK:  no LAD, no JVD CARDIO: Regular rate, well-perfused, normal S1 and S2 PULM:  CTAB no wheezing or rhonchi GI/GU:  normal bowel sounds, severely distended, nontender MSK/SPINE:  No gross deformities, no edema SKIN:  no rash, atraumatic PSYCH:  Appropriate speech and behavior  *Additional and/or pertinent findings included in MDM below  Diagnostic and Interventional Summary    EKG Interpretation  Date/Time:  Thursday December 10 2020 20:18:53 EST Ventricular Rate:  81 PR Interval:  178 QRS Duration: 70 QT Interval:  420 QTC Calculation: 487 R Axis:   77 Text Interpretation: Poor data quality, interpretation may be adversely affected Normal sinus rhythm Possible Left atrial enlargement T wave abnormality, consider anterior ischemia Abnormal ECG Confirmed by Gerlene Fee [phone]) on 12/10/2020 11:06:29 PM      Labs Reviewed  BASIC METABOLIC PANEL - Abnormal; Notable for the following components:      Result Value   Glucose, Bld 104 (*)    All other components within normal limits  HEPATIC FUNCTION PANEL - Abnormal; Notable for the following  components:   Total Protein 6.4 (*)    All other components within normal limits  CBC  LIPASE, BLOOD  PROTIME-INR  TROPONIN I (HIGH SENSITIVITY)  TROPONIN I (HIGH SENSITIVITY)    CT ABDOMEN PELVIS W CONTRAST  Final Result    DG Chest 2 View  Final Result      Medications  alum & mag hydroxide-simeth (MAALOX/MYLANTA) 200-200-20 MG/5ML suspension 30 mL (30 mLs Oral Given 12/11/20 0018)  iohexol (OMNIPAQUE) 300 MG/ML solution 100 mL (100 mLs Intravenous Contrast Given 12/11/20 0006)     Procedures  /  Critical Care Procedures  ED Course and Medical Decision Making  I have reviewed the triage vital signs, the nursing notes, and pertinent available records from the EMR.  Listed above are laboratory and imaging tests that I personally ordered, reviewed, and interpreted and then considered in my medical decision making (see below for details).  Suspect gastritis or indigestion in the setting of abdominal distention due to large ovarian mass. Also considering worsening metastatic disease or obstruction due to this mass, for which she has not followed up with a specialist reassuring vital signs, overall no acute distress. Will repeat CT imaging today and try to help her seek follow-up.     CT is unchanged, labs are reassuring, patient feels much better after GI cocktail.  No indication for further testing or admission, appropriate for discharge.  I have sent a personal message to gynecology oncology to see if we can help her set up follow-up.  Barth Kirks. [place] Small, [place] [email or web address]  Final Clinical Impressions(s) / ED Diagnoses     ICD-10-CM   1. Abdominal distention  R14.0   2. Ovarian mass  N83.8     ED Discharge Orders         Ordered    sucralfate (CARAFATE) 1 g tablet  4 times daily PRN        12/11/20 0213           Discharge Instructions Discussed with and Provided to Patient:     Discharge Instructions      You were evaluated in the Emergency Department and after careful evaluation, we did not find any emergent condition requiring admission or further testing in the hospital.  Your symptoms seem to be due to a large ovarian tumor.  It is very important that you follow-up with the specialist for further management.  You can use the Carafate medication as needed for burning upper abdominal pain.  Please return to the Emergency Department if you experience any worsening of your condition.  Thank you for allowing [place] to be a part of your care.        Maudie Flakes, MD 12/11/20 [phone]

## 2020-12-10 NOTE — ED Triage Notes (Signed)
[place] reports left chest pain /upper abdominal pain with distention this week , no emesis or diarrhea , denies fever or chills .

## 2020-12-10 NOTE — ED Notes (Signed)
Patient transported to CT 

## 2020-12-11 ENCOUNTER — Emergency Department (HOSPITAL_COMMUNITY): Payer: Medicare Other

## 2020-12-11 ENCOUNTER — Telehealth: Payer: Self-pay | Admitting: *Deleted

## 2020-12-11 DIAGNOSIS — N838 Other noninflammatory disorders of ovary, fallopian tube and broad ligament: Secondary | ICD-10-CM | POA: Diagnosis not present

## 2020-12-11 LAB — HEPATIC FUNCTION PANEL
ALT: 13 U/L (ref 0–44)
AST: 22 U/L (ref 15–41)
Albumin: 3.9 g/dL (ref 3.5–5.0)
Alkaline Phosphatase: 81 U/L (ref 38–126)
Bilirubin, Direct: 0.1 mg/dL (ref 0.0–0.2)
Indirect Bilirubin: 0.6 mg/dL (ref 0.3–0.9)
Total Bilirubin: 0.7 mg/dL (ref 0.3–1.2)
Total Protein: 6.4 g/dL — ABNORMAL LOW (ref 6.5–8.1)

## 2020-12-11 LAB — PROTIME-INR
INR: 1.1 (ref 0.8–1.2)
Prothrombin Time: 13.4 seconds (ref 11.4–15.2)

## 2020-12-11 LAB — LIPASE, BLOOD: Lipase: 27 U/L (ref 11–51)

## 2020-12-11 MED ORDER — IOHEXOL 300 MG/ML  SOLN
100.0000 mL | Freq: Once | INTRAMUSCULAR | Status: AC | PRN
  Administered 2020-12-11: 100 mL via INTRAVENOUS

## 2020-12-11 MED ORDER — SUCRALFATE 1 G PO TABS: 1.0000 g | ORAL_TABLET | Freq: Four times a day (QID) | ORAL | 0 refills | Status: DC | PRN

## 2020-12-11 NOTE — Discharge Instructions (Addendum)
You were evaluated in the Emergency Department and after careful evaluation, we did not find any emergent condition requiring admission or further testing in the hospital.  Your symptoms seem to be due to a large ovarian tumor.  It is very important that you follow-up with the specialist for further management.  You can use the Carafate medication as needed for burning upper abdominal pain.  Please call the [facility] and ask for gynecologic oncology, Dr. Jeral Pinch or Dr. Everitt Amber  Please return to the Emergency Department if you experience any worsening of your condition.  Thank you for allowing [place] to be a part of your care.

## 2020-12-11 NOTE — ED Notes (Signed)
Pt stated she has the keys to her house with her. Taxi voucher obtained.

## 2020-12-11 NOTE — Telephone Encounter (Signed)
Patient called to schedule a follow up appt after an ER visit. Patient scheduled to see Dr Berline Lopes on 2/15 per patient request. Patient given address and phone number for the clinic. Patient  requested transportation

## 2020-12-14 NOTE — Progress Notes (Signed)
Gynecologic Oncology Return Clinic Visit  12/14/20  Reason for Visit: follow-up after being lost to care in the setting of a large symptomatic mass  Treatment History: The patient reports that for several years she has noticed increasing abdominal girth. She underwent a CT scan of the abdomen and pelvis in 2018 which revealed a 15 cm cystic ovarian mass.  It is unclear at that time what intervention was planned for the cystic mass.  In April 2020 she was admitted to [facility] with symptoms of a COPD exacerbation.  She was successfully treated medically for this however because she developed an episode of emesis a CT scan of the abdomen and pelvis was performed.  This revealed a tiny amount of free pelvic fluid, without evidence of peritoneal thickening or omental caking.  There was an increased size of a 20 cm complex cystic lesion in the central pelvis and lower abdomen.  It appeared previously measured 14.7 x 10.4 x 11 cm.  The lesion showed mild mural wall thickening and nodularity.  It contained a thin peripheral septation.  There was no lymphadenopathy appreciated.  The uterus contained some small fibroids.  After optimizing her COPD she was discharged home.  Prior to discharge Ca1 25 and CEA were drawn on February 25, 2019.  The Ca1 25 was normal at 20.6 and the CEA was normal at 1.4.  She has been doing well at home since discharge.  She denied being prescribed oral steroids for long-term use.  She does not have a history of anticoagulant or antiplatelet therapy use.  Her medical history is most significant for a prior history of tobacco abuse and COPD.  She does not use home oxygen.  She is able to ambulate including up stairs without excessive shortness of breath or chest pain.  She also has hypertension and hyperlipidemia.  Her most recent creatinine demonstrated grossly normal renal function.  She has no history of prior abdominal surgeries.  She has never been pregnant.  She  is not clear about her family history including no known history of malignancies.  Interval History: Patient presents today for follow-up after being seen recently in the emergency department over the last 3 months for increasing abdominal girth and abdominal pain.  She describes the pain is intermittent, increasing sometimes with certain positions and movement.  She is currently not using any medication for the pain that she does not want to mask it.  She reports having a good appetite although has early satiety and is not able to eat much.  Denies nausea and vomiting.  She has had weight loss from areas other than her abdomen.  She has occasional constipation but otherwise notes normal bowel function.  She has increasing urinary frequency with the mass has gotten bigger.  She also endorses back pain.  Recently seen for psychiatric evaluation for depression. Friend at the appointment with Ms. Mask noted that "she no longer cares about her health."  Seen in the ED on 12/28 and again on 2/10 for abdominal symptoms.  Terms of her medical history, she has not seen her primary care provider in some time.  Her breathing has been up and down.  She has a diagnosis of COPD and a history of tobacco use although quit several years ago.  She has oxygen at home that she sometimes uses.  She also endorses intermittent coughing and wheezing.  She had a daily inhaler as well as a rescue inhaler although has not been taking medications in  some time due to her abdominal symptoms.  She also has a history of hypertension although was not currently taking medications.  She describes having occasional "pinching" on the inside of her chest improves and resolves when she works on trying to be calm.  She lives in [place] by herself.  She is not currently working.  Past Medical/Surgical History: Past Medical History:  Diagnosis Date  . Acute respiratory failure with hypoxia ([place]) 04/13/2015  . Asthma   . Back pain   .  Borderline diabetic   . Bronchitis, acute 04/13/2015  . COPD (chronic obstructive pulmonary disease) ([place])   . Depression   . Fibromyalgia   . GERD (gastroesophageal reflux disease)   . Hyperlipidemia   . Hypertension   . LOC (loss of consciousness) ([place]) 04/14/2015  . OAB (overactive bladder)   . Ovarian mass   . Polycythemia 04/13/2015  . Shortness of breath dyspnea     Past Surgical History:  Procedure Laterality Date  . COSMETIC SURGERY      History reviewed. No pertinent family history.  Social History   Socioeconomic History  . Marital status: Single    Spouse name: Not on file  . Number of children: Not on file  . Years of education: Not on file  . Highest education level: Not on file  Occupational History  . Not on file  Tobacco Use  . Smoking status: Former Smoker    Packs/day: 0.50    Years: 44.00    Pack years: 22.00    Types: Cigarettes    Quit date: 12/30/2014    Years since quitting: 5.9  . Smokeless tobacco: Never Used  Substance and Sexual Activity  . Alcohol use: No    Comment: former  . Drug use: No  . Sexual activity: Not Currently  Other Topics Concern  . Not on file  Social History Narrative  . Not on file   Social Determinants of Health   Financial Resource Strain: Not on file  Food Insecurity: Not on file  Transportation Needs: Not on file  Physical Activity: Not on file  Stress: Not on file  Social Connections: Not on file    Current Medications:  Current Outpatient Medications:  .  sucralfate (CARAFATE) 1 g tablet, Take 1 tablet (1 g total) by mouth 4 (four) times daily as needed., Disp: 30 tablet, Rfl: 0 .  docusate sodium (COLACE) 100 MG capsule, Take 1 capsule (100 mg total) by mouth every 12 (twelve) hours. (Patient not taking: Reported on 12/15/2020), Disp: 30 capsule, Rfl: 0  Review of Systems: Pertinent positives include somewhat decreased appetite, fatigue, weight loss, shortness of breath, palpitations, abdominal  distention, abdominal pain, urinary frequency, urinary incontinence, joint pain, back pain, muscle pain/cramps, headache.  Denies fevers, chills, unexplained weight changes. Denies hearing loss, neck lumps or masses, mouth sores, ringing in ears or voice changes. Denies cough or wheezing.   Denies leg swelling. Denies blood in stools, diarrhea, nausea, vomiting. Denies pain with intercourse, dysuria, hematuria. Denies hot flashes, vaginal bleeding or vaginal discharge.   Denies itching, rash, or wounds. Denies dizziness, headaches, numbness or seizures. Denies swollen lymph nodes or glands, denies easy bruising or bleeding. Denies confusion, or decreased concentration.  Physical Exam: BP (!) 153/79 (BP Location: Left Arm)   Pulse 63   Temp (!) 96.3 F (35.7 C) (Tympanic)   Resp 18   Wt 114 lb 11.2 oz (52 kg)   SpO2 100%   BMI 23.97 kg/m  General: Alert,  oriented, no acute distress. HEENT: Normocephalic, atraumatic, sclera anicteric. Chest: Unlabored breathing on room air.  Lungs are clear to auscultation in upper lung fields, minimal expiratory wheezing. Cardiovascular: Regular rate and rhythm, no murmurs. Abdomen: Markedly distended, tense, nontender to palpation.  Large mass filling the entire abdominal pelvic cavity.  Normoactive bowel sounds.   Extremities: Grossly normal range of motion.  Warm, well perfused.  No edema bilaterally. Lymphatics: No cervical, supraclavicular, or inguinal adenopathy. GU: Deferred.  Laboratory & Radiologic Studies: CT A/P on 2/11: 1. Unchanged large cystic mass within the deep pelvis extending to the upper abdomen measuring 27 x 27 x 19 cm. 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 2. The cystic mass causes mass effect upon the adjacent bowel, however no bowel obstruction is seen. 3.  Aortic Atherosclerosis  (ICD10-I70.0).  Assessment & Plan: Tracey Ferrell is a 70 y.o. 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  My suspicion is that this is a benign or borderline process given period of time that she has had it in size it has now grown to.  Discussed surgery for both treatment of her symptoms as well as diagnostic purposes.  Plan would be for cyst decompression with robotic BSO and sending the abnormal ovary to pathology for frozen section.  If borderline tumor, then I would recommend completion surgery with the addition of total hysterectomy.  If malignancy encountered, then patient would require staging which might include lymphadenectomy and omentectomy.  Given her social situation as well as her COPD, recommend that the patient spend the night.  Due to her symptoms and the size of her mass, we will add her on urgently for surgery later this week.  We will repeat tumor markers (CA-125 and CEA) with her preoperative labs.  We discussed the plan for a mini laparotomy with cyst decompression, robotic assisted bilateral salpingo-oophorectomy, possible total hysterectomy, possible staging including lymph node dissection and omentectomy, other indicated procedures. The risks of surgery were discussed in detail and she understands these to include infection; wound separation; hernia; vaginal cuff separation, injury to adjacent organs such as bowel, bladder, blood vessels, ureters and nerves; bleeding which may require blood transfusion; anesthesia risk; thromboembolic events; possible death; unforeseen complications; possible need for re-exploration; medical complications such as heart attack, stroke, pleural effusion and pneumonia; and, if full lymphadenectomy is performed the risk of lymphedema and lymphocyst. The patient will receive DVT and antibiotic prophylaxis as indicated. She voiced a clear understanding. She had the opportunity to ask  questions. Perioperative instructions were reviewed with her. Prescriptions for post-op medications were sent to her pharmacy of choice.  48 minutes of total time was spent for this patient encounter, including preparation, face-to-face counseling with the patient and coordination of care, and documentation of the encounter.  Jeral Pinch, MD  Division of Gynecologic Oncology  Department of Obstetrics and Gynecology  [facility]

## 2020-12-14 NOTE — H&P (View-Only) (Signed)
Gynecologic Oncology Return Clinic Visit  12/14/20  Reason for Visit: follow-up after being lost to care in the setting of a large symptomatic mass  Treatment History: The patient reports that for several years she has noticed increasing abdominal girth. She underwent a CT scan of the abdomen and pelvis in 2018 which revealed a 15 cm cystic ovarian mass.  It is unclear at that time what intervention was planned for the cystic mass.  In April 2020 she was admitted to [facility] with symptoms of a COPD exacerbation.  She was successfully treated medically for this however because she developed an episode of emesis a CT scan of the abdomen and pelvis was performed.  This revealed a tiny amount of free pelvic fluid, without evidence of peritoneal thickening or omental caking.  There was an increased size of a 20 cm complex cystic lesion in the central pelvis and lower abdomen.  It appeared previously measured 14.7 x 10.4 x 11 cm.  The lesion showed mild mural wall thickening and nodularity.  It contained a thin peripheral septation.  There was no lymphadenopathy appreciated.  The uterus contained some small fibroids.  After optimizing her COPD she was discharged home.  Prior to discharge Ca1 25 and CEA were drawn on February 25, 2019.  The Ca1 25 was normal at 20.6 and the CEA was normal at 1.4.  She has been doing well at home since discharge.  She denied being prescribed oral steroids for long-term use.  She does not have a history of anticoagulant or antiplatelet therapy use.  Her medical history is most significant for a prior history of tobacco abuse and COPD.  She does not use home oxygen.  She is able to ambulate including up stairs without excessive shortness of breath or chest pain.  She also has hypertension and hyperlipidemia.  Her most recent creatinine demonstrated grossly normal renal function.  She has no history of prior abdominal surgeries.  She has never been pregnant.  She  is not clear about her family history including no known history of malignancies.  Interval History: Patient presents today for follow-up after being seen recently in the emergency department over the last 3 months for increasing abdominal girth and abdominal pain.  She describes the pain is intermittent, increasing sometimes with certain positions and movement.  She is currently not using any medication for the pain that she does not want to mask it.  She reports having a good appetite although has early satiety and is not able to eat much.  Denies nausea and vomiting.  She has had weight loss from areas other than her abdomen.  She has occasional constipation but otherwise notes normal bowel function.  She has increasing urinary frequency with the mass has gotten bigger.  She also endorses back pain.  Recently seen for psychiatric evaluation for depression. Friend at the appointment with Ms. Frieson noted that "she no longer cares about her health."  Seen in the ED on 12/28 and again on 2/10 for abdominal symptoms.  Terms of her medical history, she has not seen her primary care provider in some time.  Her breathing has been up and down.  She has a diagnosis of COPD and a history of tobacco use although quit several years ago.  She has oxygen at home that she sometimes uses.  She also endorses intermittent coughing and wheezing.  She had a daily inhaler as well as a rescue inhaler although has not been taking medications in  some time due to her abdominal symptoms.  She also has a history of hypertension although was not currently taking medications.  She describes having occasional "pinching" on the inside of her chest improves and resolves when she works on trying to be calm.  She lives in [place] by herself.  She is not currently working.  Past Medical/Surgical History: Past Medical History:  Diagnosis Date  . Acute respiratory failure with hypoxia ([place]) 04/13/2015  . Asthma   . Back pain   .  Borderline diabetic   . Bronchitis, acute 04/13/2015  . COPD (chronic obstructive pulmonary disease) ([place])   . Depression   . Fibromyalgia   . GERD (gastroesophageal reflux disease)   . Hyperlipidemia   . Hypertension   . LOC (loss of consciousness) ([place]) 04/14/2015  . OAB (overactive bladder)   . Ovarian mass   . Polycythemia 04/13/2015  . Shortness of breath dyspnea     Past Surgical History:  Procedure Laterality Date  . COSMETIC SURGERY      History reviewed. No pertinent family history.  Social History   Socioeconomic History  . Marital status: Single    Spouse name: Not on file  . Number of children: Not on file  . Years of education: Not on file  . Highest education level: Not on file  Occupational History  . Not on file  Tobacco Use  . Smoking status: Former Smoker    Packs/day: 0.50    Years: 44.00    Pack years: 22.00    Types: Cigarettes    Quit date: 12/30/2014    Years since quitting: 5.9  . Smokeless tobacco: Never Used  Substance and Sexual Activity  . Alcohol use: No    Comment: former  . Drug use: No  . Sexual activity: Not Currently  Other Topics Concern  . Not on file  Social History Narrative  . Not on file   Social Determinants of Health   Financial Resource Strain: Not on file  Food Insecurity: Not on file  Transportation Needs: Not on file  Physical Activity: Not on file  Stress: Not on file  Social Connections: Not on file    Current Medications:  Current Outpatient Medications:  .  sucralfate (CARAFATE) 1 g tablet, Take 1 tablet (1 g total) by mouth 4 (four) times daily as needed., Disp: 30 tablet, Rfl: 0 .  docusate sodium (COLACE) 100 MG capsule, Take 1 capsule (100 mg total) by mouth every 12 (twelve) hours. (Patient not taking: Reported on 12/15/2020), Disp: 30 capsule, Rfl: 0  Review of Systems: Pertinent positives include somewhat decreased appetite, fatigue, weight loss, shortness of breath, palpitations, abdominal  distention, abdominal pain, urinary frequency, urinary incontinence, joint pain, back pain, muscle pain/cramps, headache.  Denies fevers, chills, unexplained weight changes. Denies hearing loss, neck lumps or masses, mouth sores, ringing in ears or voice changes. Denies cough or wheezing.   Denies leg swelling. Denies blood in stools, diarrhea, nausea, vomiting. Denies pain with intercourse, dysuria, hematuria. Denies hot flashes, vaginal bleeding or vaginal discharge.   Denies itching, rash, or wounds. Denies dizziness, headaches, numbness or seizures. Denies swollen lymph nodes or glands, denies easy bruising or bleeding. Denies confusion, or decreased concentration.  Physical Exam: BP (!) 153/79 (BP Location: Left Arm)   Pulse 63   Temp (!) 96.3 F (35.7 C) (Tympanic)   Resp 18   Wt 114 lb 11.2 oz (52 kg)   SpO2 100%   BMI 23.97 kg/m  General: Alert,  oriented, no acute distress. HEENT: Normocephalic, atraumatic, sclera anicteric. Chest: Unlabored breathing on room air.  Lungs are clear to auscultation in upper lung fields, minimal expiratory wheezing. Cardiovascular: Regular rate and rhythm, no murmurs. Abdomen: Markedly distended, tense, nontender to palpation.  Large mass filling the entire abdominal pelvic cavity.  Normoactive bowel sounds.   Extremities: Grossly normal range of motion.  Warm, well perfused.  No edema bilaterally. Lymphatics: No cervical, supraclavicular, or inguinal adenopathy. GU: Deferred.  Laboratory & Radiologic Studies: CT A/P on 2/11: 1. Unchanged large cystic mass within the deep pelvis extending to the upper abdomen measuring 27 x 27 x 19 cm. 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 2. The cystic mass causes mass effect upon the adjacent bowel, however no bowel obstruction is seen. 3.  Aortic Atherosclerosis  (ICD10-I70.0).  Assessment & Plan: Tracey Ferrell is a 69 y.o. 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  My suspicion is that this is a benign or borderline process given period of time that she has had it in size it has now grown to.  Discussed surgery for both treatment of her symptoms as well as diagnostic purposes.  Plan would be for cyst decompression with robotic BSO and sending the abnormal ovary to pathology for frozen section.  If borderline tumor, then I would recommend completion surgery with the addition of total hysterectomy.  If malignancy encountered, then patient would require staging which might include lymphadenectomy and omentectomy.  Given her social situation as well as her COPD, recommend that the patient spend the night.  Due to her symptoms and the size of her mass, we will add her on urgently for surgery later this week.  We will repeat tumor markers (CA-125 and CEA) with her preoperative labs.  We discussed the plan for a mini laparotomy with cyst decompression, robotic assisted bilateral salpingo-oophorectomy, possible total hysterectomy, possible staging including lymph node dissection and omentectomy, other indicated procedures. The risks of surgery were discussed in detail and she understands these to include infection; wound separation; hernia; vaginal cuff separation, injury to adjacent organs such as bowel, bladder, blood vessels, ureters and nerves; bleeding which may require blood transfusion; anesthesia risk; thromboembolic events; possible death; unforeseen complications; possible need for re-exploration; medical complications such as heart attack, stroke, pleural effusion and pneumonia; and, if full lymphadenectomy is performed the risk of lymphedema and lymphocyst. The patient will receive DVT and antibiotic prophylaxis as indicated. She voiced a clear understanding. She had the opportunity to ask  questions. Perioperative instructions were reviewed with her. Prescriptions for post-op medications were sent to her pharmacy of choice.  48 minutes of total time was spent for this patient encounter, including preparation, face-to-face counseling with the patient and coordination of care, and documentation of the encounter.  Jeral Pinch, MD  Division of Gynecologic Oncology  Department of Obstetrics and Gynecology  [facility]

## 2020-12-15 ENCOUNTER — Encounter: Payer: Self-pay | Admitting: Gynecologic Oncology

## 2020-12-15 ENCOUNTER — Inpatient Hospital Stay: Payer: Medicare Other | Attending: Gynecologic Oncology | Admitting: Gynecologic Oncology

## 2020-12-15 ENCOUNTER — Other Ambulatory Visit: Payer: Self-pay

## 2020-12-15 ENCOUNTER — Other Ambulatory Visit: Payer: Self-pay | Admitting: Gynecologic Oncology

## 2020-12-15 VITALS — BP 153/79 | HR 63 | Temp 96.3°F | Resp 18 | Wt 114.7 lb

## 2020-12-15 DIAGNOSIS — D398 Neoplasm of uncertain behavior of other specified female genital organs: Secondary | ICD-10-CM | POA: Diagnosis not present

## 2020-12-15 DIAGNOSIS — M549 Dorsalgia, unspecified: Secondary | ICD-10-CM | POA: Insufficient documentation

## 2020-12-15 DIAGNOSIS — Z79899 Other long term (current) drug therapy: Secondary | ICD-10-CM | POA: Insufficient documentation

## 2020-12-15 DIAGNOSIS — R634 Abnormal weight loss: Secondary | ICD-10-CM | POA: Insufficient documentation

## 2020-12-15 DIAGNOSIS — R109 Unspecified abdominal pain: Secondary | ICD-10-CM | POA: Insufficient documentation

## 2020-12-15 DIAGNOSIS — R6881 Early satiety: Secondary | ICD-10-CM | POA: Insufficient documentation

## 2020-12-15 DIAGNOSIS — M797 Fibromyalgia: Secondary | ICD-10-CM | POA: Insufficient documentation

## 2020-12-15 DIAGNOSIS — I10 Essential (primary) hypertension: Secondary | ICD-10-CM | POA: Insufficient documentation

## 2020-12-15 DIAGNOSIS — E119 Type 2 diabetes mellitus without complications: Secondary | ICD-10-CM | POA: Insufficient documentation

## 2020-12-15 DIAGNOSIS — R32 Unspecified urinary incontinence: Secondary | ICD-10-CM | POA: Insufficient documentation

## 2020-12-15 DIAGNOSIS — R140 Abdominal distension (gaseous): Secondary | ICD-10-CM | POA: Insufficient documentation

## 2020-12-15 DIAGNOSIS — N83299 Other ovarian cyst, unspecified side: Secondary | ICD-10-CM

## 2020-12-15 DIAGNOSIS — R0602 Shortness of breath: Secondary | ICD-10-CM | POA: Insufficient documentation

## 2020-12-15 DIAGNOSIS — R002 Palpitations: Secondary | ICD-10-CM | POA: Insufficient documentation

## 2020-12-15 DIAGNOSIS — F32A Depression, unspecified: Secondary | ICD-10-CM | POA: Insufficient documentation

## 2020-12-15 DIAGNOSIS — J449 Chronic obstructive pulmonary disease, unspecified: Secondary | ICD-10-CM | POA: Insufficient documentation

## 2020-12-15 DIAGNOSIS — D399 Neoplasm of uncertain behavior of female genital organ, unspecified: Secondary | ICD-10-CM

## 2020-12-15 DIAGNOSIS — R5383 Other fatigue: Secondary | ICD-10-CM | POA: Insufficient documentation

## 2020-12-15 DIAGNOSIS — K219 Gastro-esophageal reflux disease without esophagitis: Secondary | ICD-10-CM | POA: Insufficient documentation

## 2020-12-15 DIAGNOSIS — E785 Hyperlipidemia, unspecified: Secondary | ICD-10-CM | POA: Insufficient documentation

## 2020-12-15 DIAGNOSIS — Z87891 Personal history of nicotine dependence: Secondary | ICD-10-CM | POA: Insufficient documentation

## 2020-12-15 MED ORDER — SENNOSIDES-DOCUSATE SODIUM 8.6-50 MG PO TABS: 2.0000 | ORAL_TABLET | Freq: Every day | ORAL | 0 refills | Status: DC

## 2020-12-15 MED ORDER — TRAMADOL HCL 50 MG PO TABS: 50.0000 mg | ORAL_TABLET | Freq: Four times a day (QID) | ORAL | 0 refills | Status: DC | PRN

## 2020-12-15 MED ORDER — IBUPROFEN 600 MG PO TABS: 600.0000 mg | ORAL_TABLET | Freq: Four times a day (QID) | ORAL | 0 refills | Status: DC | PRN

## 2020-12-15 NOTE — Progress Notes (Signed)
COVID Vaccine Completed: Date COVID Vaccine completed: Has received booster: COVID vaccine manufacturer: [place]  Date of COVID positive in last 90 days:  PCP - Benay Pike, MD Cardiologist -   Chest x-ray -  EKG - 12/11/20 IN EPIC Stress Test -  ECHO - greater than 2 years in epic Cardiac Cath -  Pacemaker/ICD device last checked:  Sleep Study -  CPAP -   Fasting Blood Sugar -  Checks Blood Sugar _____ times a day  Blood Thinner Instructions: Aspirin Instructions: Last Dose:  Activity level:  Unable to go up a flight of stairs without symptoms   Can go up a flight of stairs and activities of daily living without stopping and without symptoms   Able to exercise without symptoms     Anesthesia review:   Patient denies shortness of breath, fever, cough and chest pain at PAT appointment   Patient verbalized understanding of instructions that were given to them at the PAT appointment. Patient was also instructed that they will need to review over the PAT instructions again at home before surgery.

## 2020-12-15 NOTE — Patient Instructions (Signed)
Preparing for your Surgery  Plan for surgery on December 17, 2020 with Dr. Jeral Pinch at [place] will be scheduled for a mini laparotomy for cyst decompression (larger incision to drain the cyst at the beginning of surgery), robotic assisted laparoscopic bilateral salpingo-oophorectomy (removal of both ovaries and fallopian tubes), possible total hysterectomy (removal of the uterus and cervix), possible staging if a cancer is identified.   Pre-operative Testing -You will receive a phone call from presurgical testing at [facility] to arrange for a pre-operative appointment, labs, and COVID test. The COVID test normally happens 3 days prior to the surgery and they ask that you self quarantine after the test up until surgery to decrease chance of exposure.  -Bring your insurance card, copy of an advanced directive if applicable, medication list  -At that visit, you will be asked to sign a consent for a possible blood transfusion in case a transfusion becomes necessary during surgery.  The need for a blood transfusion is rare but having consent is a necessary part of your care.     -You should not be taking blood thinners or aspirin at least ten days prior to surgery unless instructed by your surgeon.  -Do not take supplements such as fish oil (omega 3), red yeast rice, turmeric before your surgery. You want to avoid medications with aspirin in them including headache powders such as BC or Goody's), Excedrin migraine.  Day Before Surgery at [facility] will be asked to take in a light diet the day before surgery. You will be advised you can have clear liquids up until 3 hours before your surgery.    Eat a light diet the day before surgery.  Examples including soups, broths, toast, yogurt, mashed potatoes.  AVOID GAS PRODUCING FOODS. Things to avoid include carbonated beverages (fizzy beverages, sodas), raw fruits and raw vegetables (uncooked), or beans.   If your  bowels are filled with gas, your surgeon will have difficulty visualizing your pelvic organs which increases your surgical risks.  Your role in recovery Your role is to become active as soon as directed by your doctor, while still giving yourself time to heal.  Rest when you feel tired. You will be asked to do the following in order to speed your recovery:  - Cough and breathe deeply. This helps to clear and expand your lungs and can prevent pneumonia after surgery.  - [place]. Do mild physical activity. Walking or moving your legs help your circulation and body functions return to normal. Do not try to get up or walk alone the first time after surgery.   -If you develop swelling on one leg or the other, pain in the back of your leg, redness/warmth in one of your legs, please call the office or go to the Emergency Room to have a doppler to rule out a blood clot. For shortness of breath, chest pain-seek care in the Emergency Room as soon as possible. - Actively manage your pain. Managing your pain lets you move in comfort. We will ask you to rate your pain on a scale of zero to 10. It is your responsibility to tell your doctor or nurse where and how much you hurt so your pain can be treated.  Special Considerations -If you are diabetic, you may be placed on insulin after surgery to have closer control over your blood sugars to promote healing and recovery.  This does not mean that you will be  discharged on insulin.  If applicable, your oral antidiabetics will be resumed when you are tolerating a solid diet.  -Your final pathology results from surgery should be available around one week after surgery and the results will be relayed to you when available.  -Dr. Lahoma Crocker is the surgeon that assists your GYN Oncologist with surgery.  If you end up staying the night, the next day after your surgery you will either see Dr. Denman George, Dr. Berline Lopes, or Dr. Lahoma Crocker.  -FMLA  forms can be faxed to [phone] and please allow 5-7 business days for completion.  Pain Management After Surgery -You have been prescribed your pain medication and bowel regimen medications before surgery so that you can have these available when you are discharged from the hospital. The pain medication is for use ONLY AFTER surgery and a new prescription will not be given.   -Make sure that you have Tylenol and Ibuprofen at home to use on a regular basis after surgery for pain control. We recommend alternating the medications every hour to six hours since they work differently and are processed in the body differently for pain relief.  -Review the attached handout on narcotic use and their risks and side effects.   Bowel Regimen -You have been prescribed Sennakot-S to take nightly to prevent constipation especially if you are taking the narcotic pain medication intermittently.  It is important to prevent constipation and drink adequate amounts of liquids. You can stop taking this medication when you are not taking pain medication and you are back on your normal bowel routine.  Risks of Surgery Risks of surgery are low but include bleeding, infection, damage to surrounding structures, re-operation, blood clots, and very rarely death.   Blood Transfusion Information (For the consent to be signed before surgery)  We will be checking your blood type before surgery so in case of emergencies, we will know what type of blood you would need.                                            WHAT IS A BLOOD TRANSFUSION?  A transfusion is the replacement of blood or some of its parts. Blood is made up of multiple cells which provide different functions.  Red blood cells carry oxygen and are used for blood loss replacement.  White blood cells fight against infection.  Platelets control bleeding.  Plasma helps clot blood.  Other blood products are available for specialized needs, such as hemophilia  or other clotting disorders. BEFORE THE TRANSFUSION  Who gives blood for transfusions?   You may be able to donate blood to be used at a later date on yourself (autologous donation).  Relatives can be asked to donate blood. This is generally not any safer than if you have received blood from a stranger. The same precautions are taken to ensure safety when a relative's blood is donated.  Healthy volunteers who are fully evaluated to make sure their blood is safe. This is blood bank blood. Transfusion therapy is the safest it has ever been in the practice of medicine. Before blood is taken from a donor, a complete history is taken to make sure that person has no history of diseases nor engages in risky social behavior (examples are intravenous drug use or sexual activity with multiple partners). The donor's travel history is screened to minimize risk of transmitting  infections, such as malaria. The donated blood is tested for signs of infectious diseases, such as HIV and hepatitis. The blood is then tested to be sure it is compatible with you in order to minimize the chance of a transfusion reaction. If you or a relative donates blood, this is often done in anticipation of surgery and is not appropriate for emergency situations. It takes many days to process the donated blood. RISKS AND COMPLICATIONS Although transfusion therapy is very safe and saves many lives, the main dangers of transfusion include:   Getting an infectious disease.  Developing a transfusion reaction. This is an allergic reaction to something in the blood you were given. Every precaution is taken to prevent this. The decision to have a blood transfusion has been considered carefully by your caregiver before blood is given. Blood is not given unless the benefits outweigh the risks.  AFTER SURGERY INSTRUCTIONS  Return to work: 4 weeks if applicable  You will have a white honeycomb dressing over your larger incision. This  dressing can be removed 5 days after surgery and you do not need to reapply a new dressing. Once you remove the dressing, you will notice that you have the surgical glue (dermabond) on the incision and this will peel off on its own. You can get this dressing wet in the shower the days after surgery prior to removal on the 5th day.You will have a white honeycomb dressing over your larger incision. This dressing can be removed 5 days after surgery and you do not need to reapply a new dressing. Once you remove the dressing, you will notice that you have the surgical glue (dermabond) on the incision and this will peel off on its own. You can get this dressing wet in the shower the days after surgery prior to removal on the 5th day.  Activity: 1. Be up and out of the bed during the day.  Take a nap if needed.  You may walk up steps but be careful and use the hand rail.  Stair climbing will tire you more than you think, you may need to stop part way and rest.   2. No lifting or straining for 6 weeks over 10 pounds. No pushing, pulling, straining for 6 weeks.  3. No driving for 1-2 week(s).  Do not drive if you are taking narcotic pain medicine and make sure that your reaction time has returned.   4. You can shower as soon as the next day after surgery. Shower daily.  Use your regular soap and water (not directly on the incision) and pat your incision(s) dry afterwards; don't rub.  No tub baths or submerging your body in water until cleared by your surgeon. If you have the soap that was given to you by pre-surgical testing that was used before surgery, you do not need to use it afterwards because this can irritate your incisions.   5. No sexual activity and nothing in the vagina for 4 weeks, 8 weeks if you have a hysterectomy.  6. You may experience a small amount of clear drainage from your incisions, which is normal.  If the drainage persists, increases, or changes color please call the office.  7. Do not  use creams, lotions, or ointments such as neosporin on your incisions after surgery until advised by your surgeon because they can cause removal of the dermabond glue on your incisions.    8. You may experience vaginal spotting after surgery or around the 6-8 week  mark from surgery when the stitches at the top of the vagina begin to dissolve (if you have a hysterectomy).  The spotting is normal but if you experience heavy bleeding, call our office.  9. Take Tylenol or ibuprofen first for pain and only use narcotic pain medication for severe pain not relieved by the Tylenol or Ibuprofen.  Monitor your Tylenol intake to a max of 4,000 mg in a 24 hour period. You can alternate these medications after surgery.  Diet: 1. Low sodium Heart Healthy Diet is recommended but you are cleared to resume your normal (before surgery) diet after your procedure.  2. It is safe to use a laxative, such as Miralax or Colace, if you have difficulty moving your bowels. You have been prescribed Sennakot at bedtime every evening to keep bowel movements regular and to prevent constipation.    Wound Care: 1. Keep clean and dry.  Shower daily.  Reasons to call the Doctor:  Fever - Oral temperature greater than 100.4 degrees Fahrenheit  Foul-smelling vaginal discharge  Difficulty urinating  Nausea and vomiting  Increased pain at the site of the incision that is unrelieved with pain medicine.  Difficulty breathing with or without chest pain  New calf pain especially if only on one side  Sudden, continuing increased vaginal bleeding with or without clots.   Contacts: For questions or concerns you should contact:  Dr. Jeral Pinch at [phone]  Joylene John, NP at [phone]  After Hours: call [phone] and have the GYN Oncologist paged/contacted (after 5 pm or on the weekends).  Messages sent via mychart are for non-urgent matters and are not responded to after hours so for urgent needs,  please call the after hours number.

## 2020-12-16 ENCOUNTER — Other Ambulatory Visit: Payer: Self-pay

## 2020-12-16 ENCOUNTER — Encounter (HOSPITAL_COMMUNITY): Payer: Self-pay | Admitting: Gynecologic Oncology

## 2020-12-16 NOTE — Progress Notes (Addendum)
COVID Vaccine Completed:  No Date COVID Vaccine completed: Has received booster: COVID vaccine manufacturer: [employer]   Date of COVID positive in last 90 days:  N/A  PCP - Benay Pike, MD Cardiologist - N/A  Chest x-ray -  EKG - 12/11/20 IN EPIC Stress Test -  ECHO - greater than 2 years in epic Cardiac Cath -  Pacemaker/ICD device last checked:  Sleep Study - N/A CPAP -   Fasting Blood Sugar - N/A Checks Blood Sugar _____ times a day  Blood Thinner Instructions:N/A Aspirin Instructions: Last Dose:  Activity level:          Can go up a flight of stairs and perform activities of daily living without stopping and without symptoms.  Patient lives alone                           Anesthesia review:  COPD.  Uses oxygen prn when she feels that she is short of breath.  Hx of respiratory failure.  Patient denies shortness of breath, fever, cough and chest pain during PAT interview over the phone.  States that she sometimes feels short of breath and will use oxygen.     Patient verbalized understanding of instructions that were given to them at the PAT appointment. Patient was also instructed that they will need to review over the PAT instructions again at home before surgery.

## 2020-12-17 ENCOUNTER — Encounter (HOSPITAL_COMMUNITY)
Admission: RE | Disposition: A | Discharge status: Home / Self Care | Payer: Self-pay | Source: Home / Self Care | Attending: Gynecologic Oncology

## 2020-12-17 ENCOUNTER — Other Ambulatory Visit: Payer: Self-pay

## 2020-12-17 ENCOUNTER — Encounter (HOSPITAL_COMMUNITY): Payer: Self-pay | Admitting: Gynecologic Oncology

## 2020-12-17 ENCOUNTER — Inpatient Hospital Stay (HOSPITAL_COMMUNITY)
Admission: RE | Admit: 2020-12-17 | Discharge: 2020-12-19 | DRG: 743 | Disposition: A | Discharge status: Home / Self Care | Payer: Medicare Other | Attending: Gynecologic Oncology | Admitting: Gynecologic Oncology

## 2020-12-17 ENCOUNTER — Ambulatory Visit (HOSPITAL_COMMUNITY): Payer: Medicare Other | Admitting: Physician Assistant

## 2020-12-17 DIAGNOSIS — D270 Benign neoplasm of right ovary: Secondary | ICD-10-CM | POA: Diagnosis present

## 2020-12-17 DIAGNOSIS — Z6823 Body mass index (BMI) 23.0-23.9, adult: Secondary | ICD-10-CM

## 2020-12-17 DIAGNOSIS — R1900 Intra-abdominal and pelvic swelling, mass and lump, unspecified site: Secondary | ICD-10-CM | POA: Diagnosis present

## 2020-12-17 DIAGNOSIS — R6881 Early satiety: Secondary | ICD-10-CM | POA: Diagnosis present

## 2020-12-17 DIAGNOSIS — M797 Fibromyalgia: Secondary | ICD-10-CM | POA: Diagnosis present

## 2020-12-17 DIAGNOSIS — F32A Depression, unspecified: Secondary | ICD-10-CM | POA: Diagnosis present

## 2020-12-17 DIAGNOSIS — I10 Essential (primary) hypertension: Secondary | ICD-10-CM | POA: Diagnosis present

## 2020-12-17 DIAGNOSIS — Z87891 Personal history of nicotine dependence: Secondary | ICD-10-CM

## 2020-12-17 DIAGNOSIS — R7303 Prediabetes: Secondary | ICD-10-CM | POA: Diagnosis present

## 2020-12-17 DIAGNOSIS — R634 Abnormal weight loss: Secondary | ICD-10-CM | POA: Diagnosis present

## 2020-12-17 DIAGNOSIS — J449 Chronic obstructive pulmonary disease, unspecified: Secondary | ICD-10-CM | POA: Diagnosis present

## 2020-12-17 DIAGNOSIS — E785 Hyperlipidemia, unspecified: Secondary | ICD-10-CM | POA: Diagnosis present

## 2020-12-17 DIAGNOSIS — K5900 Constipation, unspecified: Secondary | ICD-10-CM | POA: Diagnosis present

## 2020-12-17 DIAGNOSIS — N83201 Unspecified ovarian cyst, right side: Principal | ICD-10-CM | POA: Diagnosis present

## 2020-12-17 DIAGNOSIS — D259 Leiomyoma of uterus, unspecified: Secondary | ICD-10-CM | POA: Diagnosis not present

## 2020-12-17 DIAGNOSIS — Z20822 Contact with and (suspected) exposure to covid-19: Secondary | ICD-10-CM | POA: Diagnosis present

## 2020-12-17 DIAGNOSIS — N83299 Other ovarian cyst, unspecified side: Secondary | ICD-10-CM

## 2020-12-17 DIAGNOSIS — N83209 Unspecified ovarian cyst, unspecified side: Secondary | ICD-10-CM | POA: Diagnosis present

## 2020-12-17 DIAGNOSIS — K219 Gastro-esophageal reflux disease without esophagitis: Secondary | ICD-10-CM | POA: Diagnosis present

## 2020-12-17 HISTORY — PX: ROBOTIC ASSISTED BILATERAL SALPINGO OOPHERECTOMY: SHX6078

## 2020-12-17 LAB — COMPREHENSIVE METABOLIC PANEL
ALT: 16 U/L (ref 0–44)
AST: 25 U/L (ref 15–41)
Albumin: 4.8 g/dL (ref 3.5–5.0)
Alkaline Phosphatase: 90 U/L (ref 38–126)
Anion gap: 12 (ref 5–15)
BUN: 11 mg/dL (ref 8–23)
CO2: 26 mmol/L (ref 22–32)
Calcium: 9.7 mg/dL (ref 8.9–10.3)
Chloride: 100 mmol/L (ref 98–111)
Creatinine, Ser: 0.75 mg/dL (ref 0.44–1.00)
GFR, Estimated: >60 mL/min (ref >60)
Glucose, Bld: 94 mg/dL (ref 70–99)
Potassium: 4 mmol/L (ref 3.5–5.1)
Sodium: 138 mmol/L (ref 135–145)
Total Bilirubin: 0.9 mg/dL (ref 0.3–1.2)
Total Protein: 8 g/dL (ref 6.5–8.1)

## 2020-12-17 LAB — CBC
HCT: 43.7 % (ref 36.0–46.0)
Hemoglobin: 13.8 g/dL (ref 12.0–15.0)
MCH: 29.7 pg (ref 26.0–34.0)
MCHC: 31.6 g/dL (ref 30.0–36.0)
MCV: 94 fL (ref 80.0–100.0)
Platelets: 339 10*3/uL (ref 150–400)
RBC: 4.65 MIL/uL (ref 3.87–5.11)
RDW: 12.6 % (ref 11.5–15.5)
WBC: 5.9 10*3/uL (ref 4.0–10.5)
nRBC: 0 % (ref 0.0–0.2)

## 2020-12-17 LAB — TYPE AND SCREEN
ABO/RH(D): A POS
Antibody Screen: NEGATIVE

## 2020-12-17 LAB — SARS CORONAVIRUS 2 BY RT PCR (HOSPITAL ORDER, PERFORMED IN ~~LOC~~ HOSPITAL LAB): SARS Coronavirus 2: NEGATIVE

## 2020-12-17 SURGERY — SALPINGO-OOPHORECTOMY, BILATERAL, ROBOT-ASSISTED
Anesthesia: General

## 2020-12-17 MED ORDER — SENNOSIDES-DOCUSATE SODIUM 8.6-50 MG PO TABS
2.0000 | ORAL_TABLET | Freq: Every day | ORAL | Status: DC
  Administered 2020-12-17 – 2020-12-18 (×2): 2 via ORAL
  Filled 2020-12-17 (×2): qty 2

## 2020-12-17 MED ORDER — PANTOPRAZOLE SODIUM 40 MG PO TBEC
40.0000 mg | DELAYED_RELEASE_TABLET | Freq: Every day | ORAL | Status: DC
  Administered 2020-12-17 – 2020-12-19 (×3): 40 mg via ORAL
  Filled 2020-12-17 (×3): qty 1

## 2020-12-17 MED ORDER — FENTANYL CITRATE (PF) 250 MCG/5ML IJ SOLN
INTRAMUSCULAR | Status: AC
  Filled 2020-12-17: qty 5

## 2020-12-17 MED ORDER — ONDANSETRON HCL 4 MG/2ML IJ SOLN: 4.0000 mg | Freq: Once | INTRAMUSCULAR | Status: DC | PRN

## 2020-12-17 MED ORDER — ORAL CARE MOUTH RINSE: 15.0000 mL | Freq: Once | OROMUCOSAL | Status: AC

## 2020-12-17 MED ORDER — PROPOFOL 10 MG/ML IV BOLUS
INTRAVENOUS | Status: AC
  Filled 2020-12-17: qty 20

## 2020-12-17 MED ORDER — LACTATED RINGERS IV SOLN: INTRAVENOUS | Status: DC

## 2020-12-17 MED ORDER — LACTATED RINGERS IR SOLN
Status: DC | PRN
  Administered 2020-12-17: 1000 mL

## 2020-12-17 MED ORDER — HYDRALAZINE HCL 20 MG/ML IJ SOLN
INTRAMUSCULAR | Status: DC | PRN
  Administered 2020-12-17: 5 mg via INTRAVENOUS

## 2020-12-17 MED ORDER — HEPARIN SODIUM (PORCINE) 5000 UNIT/ML IJ SOLN
5000.0000 [IU] | INTRAMUSCULAR | Status: AC
  Administered 2020-12-17: 5000 [IU] via SUBCUTANEOUS
  Filled 2020-12-17: qty 1

## 2020-12-17 MED ORDER — PHENYLEPHRINE HCL (PRESSORS) 10 MG/ML IV SOLN
INTRAVENOUS | Status: AC
  Filled 2020-12-17: qty 1

## 2020-12-17 MED ORDER — DEXAMETHASONE SODIUM PHOSPHATE 10 MG/ML IJ SOLN
INTRAMUSCULAR | Status: AC
  Filled 2020-12-17: qty 1

## 2020-12-17 MED ORDER — ENOXAPARIN SODIUM 40 MG/0.4ML ~~LOC~~ SOLN
40.0000 mg | SUBCUTANEOUS | Status: DC
  Administered 2020-12-18 – 2020-12-19 (×2): 40 mg via SUBCUTANEOUS
  Filled 2020-12-17 (×2): qty 0.4

## 2020-12-17 MED ORDER — ACETAMINOPHEN 325 MG PO TABS: 325.0000 mg | ORAL_TABLET | ORAL | Status: DC | PRN

## 2020-12-17 MED ORDER — CHLORHEXIDINE GLUCONATE 0.12 % MT SOLN
15.0000 mL | Freq: Once | OROMUCOSAL | Status: AC
  Administered 2020-12-17: 15 mL via OROMUCOSAL

## 2020-12-17 MED ORDER — LIDOCAINE 2% (20 MG/ML) 5 ML SYRINGE
INTRAMUSCULAR | Status: DC | PRN
  Administered 2020-12-17: 40 mg via INTRAVENOUS

## 2020-12-17 MED ORDER — ROCURONIUM BROMIDE 10 MG/ML (PF) SYRINGE
PREFILLED_SYRINGE | INTRAVENOUS | Status: DC | PRN
  Administered 2020-12-17: 50 mg via INTRAVENOUS

## 2020-12-17 MED ORDER — MEPERIDINE HCL 50 MG/ML IJ SOLN: 6.2500 mg | INTRAMUSCULAR | Status: DC | PRN

## 2020-12-17 MED ORDER — ACETAMINOPHEN 500 MG PO TABS
1000.0000 mg | ORAL_TABLET | Freq: Two times a day (BID) | ORAL | Status: DC
  Administered 2020-12-17 – 2020-12-19 (×4): 1000 mg via ORAL
  Filled 2020-12-17 (×4): qty 2

## 2020-12-17 MED ORDER — FENTANYL CITRATE (PF) 250 MCG/5ML IJ SOLN
INTRAMUSCULAR | Status: DC | PRN
  Administered 2020-12-17 (×2): 50 ug via INTRAVENOUS
  Administered 2020-12-17: 25 ug via INTRAVENOUS
  Administered 2020-12-17 (×2): 50 ug via INTRAVENOUS
  Administered 2020-12-17: 25 ug via INTRAVENOUS

## 2020-12-17 MED ORDER — SUGAMMADEX SODIUM 200 MG/2ML IV SOLN
INTRAVENOUS | Status: DC | PRN
  Administered 2020-12-17: 100 mg via INTRAVENOUS
  Administered 2020-12-17: 50 mg via INTRAVENOUS

## 2020-12-17 MED ORDER — IBUPROFEN 400 MG PO TABS
600.0000 mg | ORAL_TABLET | Freq: Four times a day (QID) | ORAL | Status: DC | PRN
  Filled 2020-12-17: qty 1

## 2020-12-17 MED ORDER — DEXAMETHASONE SODIUM PHOSPHATE 4 MG/ML IJ SOLN
4.0000 mg | INTRAMUSCULAR | Status: AC
  Administered 2020-12-17: 4 mg via INTRAVENOUS

## 2020-12-17 MED ORDER — ONDANSETRON HCL 4 MG/2ML IJ SOLN
INTRAMUSCULAR | Status: DC | PRN
  Administered 2020-12-17: 4 mg via INTRAVENOUS

## 2020-12-17 MED ORDER — ONDANSETRON HCL 4 MG PO TABS: 4.0000 mg | ORAL_TABLET | Freq: Four times a day (QID) | ORAL | Status: DC | PRN

## 2020-12-17 MED ORDER — STERILE WATER FOR IRRIGATION IR SOLN
Status: DC | PRN
  Administered 2020-12-17: 1000 mL

## 2020-12-17 MED ORDER — SUCCINYLCHOLINE CHLORIDE 200 MG/10ML IV SOSY
PREFILLED_SYRINGE | INTRAVENOUS | Status: DC | PRN
  Administered 2020-12-17: 100 mg via INTRAVENOUS

## 2020-12-17 MED ORDER — BUPIVACAINE HCL 0.25 % IJ SOLN
INTRAMUSCULAR | Status: AC
  Filled 2020-12-17: qty 1

## 2020-12-17 MED ORDER — ACETAMINOPHEN 160 MG/5ML PO SOLN: 325.0000 mg | ORAL | Status: DC | PRN

## 2020-12-17 MED ORDER — LACTATED RINGERS IV SOLN
INTRAVENOUS | Status: DC | PRN
Start: 2020-12-17 — End: 2020-12-17

## 2020-12-17 MED ORDER — PHENYLEPHRINE HCL-NACL 10-0.9 MG/250ML-% IV SOLN
INTRAVENOUS | Status: DC | PRN
  Administered 2020-12-17: 50 ug/min via INTRAVENOUS

## 2020-12-17 MED ORDER — GABAPENTIN 100 MG PO CAPS
200.0000 mg | ORAL_CAPSULE | ORAL | Status: AC
  Administered 2020-12-17: 200 mg via ORAL
  Filled 2020-12-17 (×2): qty 2

## 2020-12-17 MED ORDER — KCL IN DEXTROSE-NACL 20-5-0.45 MEQ/L-%-% IV SOLN
INTRAVENOUS | Status: DC
  Filled 2020-12-17 (×2): qty 1000

## 2020-12-17 MED ORDER — PROPOFOL 10 MG/ML IV BOLUS
INTRAVENOUS | Status: DC | PRN
  Administered 2020-12-17: 130 mg via INTRAVENOUS
  Administered 2020-12-17: 30 mg via INTRAVENOUS

## 2020-12-17 MED ORDER — OXYCODONE HCL 5 MG/5ML PO SOLN: 5.0000 mg | Freq: Once | ORAL | Status: DC | PRN

## 2020-12-17 MED ORDER — HYDRALAZINE HCL 20 MG/ML IJ SOLN
INTRAMUSCULAR | Status: AC
  Filled 2020-12-17: qty 1

## 2020-12-17 MED ORDER — ONDANSETRON HCL 4 MG/2ML IJ SOLN
INTRAMUSCULAR | Status: AC
  Filled 2020-12-17: qty 2

## 2020-12-17 MED ORDER — ALBUTEROL SULFATE HFA 108 (90 BASE) MCG/ACT IN AERS
1.0000 | INHALATION_SPRAY | Freq: Four times a day (QID) | RESPIRATORY_TRACT | Status: DC | PRN
  Filled 2020-12-17: qty 6.7

## 2020-12-17 MED ORDER — FENTANYL CITRATE (PF) 100 MCG/2ML IJ SOLN: 25.0000 ug | INTRAMUSCULAR | Status: DC | PRN

## 2020-12-17 MED ORDER — ONDANSETRON HCL 4 MG/2ML IJ SOLN: 4.0000 mg | Freq: Four times a day (QID) | INTRAMUSCULAR | Status: DC | PRN

## 2020-12-17 MED ORDER — AMLODIPINE BESYLATE 5 MG PO TABS
5.0000 mg | ORAL_TABLET | Freq: Every day | ORAL | Status: DC
  Administered 2020-12-17 – 2020-12-19 (×2): 5 mg via ORAL
  Filled 2020-12-17 (×3): qty 1

## 2020-12-17 MED ORDER — OXYCODONE HCL 5 MG PO TABS: 5.0000 mg | ORAL_TABLET | Freq: Once | ORAL | Status: DC | PRN

## 2020-12-17 MED ORDER — BUPIVACAINE HCL 0.25 % IJ SOLN
INTRAMUSCULAR | Status: DC | PRN
  Administered 2020-12-17: 50 mL

## 2020-12-17 MED ORDER — ACETAMINOPHEN 500 MG PO TABS
1000.0000 mg | ORAL_TABLET | ORAL | Status: AC
  Administered 2020-12-17: 1000 mg via ORAL
  Filled 2020-12-17: qty 2

## 2020-12-17 MED ORDER — OXYCODONE HCL 5 MG PO TABS
5.0000 mg | ORAL_TABLET | ORAL | Status: DC | PRN
Start: 2020-12-17 — End: 2020-12-19
  Administered 2020-12-17 – 2020-12-19 (×6): 5 mg via ORAL
  Filled 2020-12-17 (×6): qty 1

## 2020-12-17 MED ORDER — CEFAZOLIN SODIUM-DEXTROSE 2-4 GM/100ML-% IV SOLN
2.0000 g | INTRAVENOUS | Status: AC
  Administered 2020-12-17: 2 g via INTRAVENOUS
  Filled 2020-12-17: qty 100

## 2020-12-17 MED ORDER — GLYCOPYRROLATE 0.2 MG/ML IJ SOLN
INTRAMUSCULAR | Status: DC | PRN
  Administered 2020-12-17: .2 mg via INTRAVENOUS

## 2020-12-17 MED ORDER — ROCURONIUM BROMIDE 10 MG/ML (PF) SYRINGE
PREFILLED_SYRINGE | INTRAVENOUS | Status: AC
  Filled 2020-12-17: qty 10

## 2020-12-17 SURGICAL SUPPLY — 71 items
ADH SKN CLS APL DERMABOND .7 (GAUZE/BANDAGES/DRESSINGS) ×1
AGENT HMST KT MTR STRL THRMB (HEMOSTASIS) ×1
APL ESCP 34 STRL LF DISP (HEMOSTASIS) ×1
APPLICATOR SURGIFLO ENDO (HEMOSTASIS) ×2 IMPLANT
BACTOSHIELD CHG 4% 4OZ (MISCELLANEOUS) ×1
BAG LAPAROSCOPIC 12 15 PORT 16 (BASKET) IMPLANT
BAG RETRIEVAL 12/15 (BASKET)
BAG SPEC RTRVL LRG 6X4 10 (ENDOMECHANICALS)
BLADE SURG SZ10 CARB STEEL (BLADE) IMPLANT
COVER BACK TABLE 60X90IN (DRAPES) ×2 IMPLANT
COVER TIP SHEARS 8 DVNC (MISCELLANEOUS) ×1 IMPLANT
COVER TIP SHEARS 8MM DA VINCI (MISCELLANEOUS) ×2
COVER WAND RF STERILE (DRAPES) IMPLANT
DECANTER SPIKE VIAL GLASS SM (MISCELLANEOUS) IMPLANT
DERMABOND ADVANCED (GAUZE/BANDAGES/DRESSINGS) ×1
DERMABOND ADVANCED .7 DNX12 (GAUZE/BANDAGES/DRESSINGS) ×1 IMPLANT
DRAPE ARM DVNC X/XI (DISPOSABLE) ×4 IMPLANT
DRAPE COLUMN DVNC XI (DISPOSABLE) ×1 IMPLANT
DRAPE DA VINCI XI ARM (DISPOSABLE) ×8
DRAPE DA VINCI XI COLUMN (DISPOSABLE) ×2
DRAPE SHEET LG 3/4 BI-LAMINATE (DRAPES) ×2 IMPLANT
DRAPE SURG IRRIG POUCH 19X23 (DRAPES) ×2 IMPLANT
DRSG OPSITE POSTOP 4X6 (GAUZE/BANDAGES/DRESSINGS) ×2 IMPLANT
DRSG OPSITE POSTOP 4X8 (GAUZE/BANDAGES/DRESSINGS) IMPLANT
ELECT PENCIL ROCKER SW 15FT (MISCELLANEOUS) IMPLANT
ELECT REM PT RETURN 15FT ADLT (MISCELLANEOUS) ×2 IMPLANT
GLOVE SURG ENC MOIS LTX SZ6 (GLOVE) ×8 IMPLANT
GLOVE SURG ENC MOIS LTX SZ6.5 (GLOVE) ×4 IMPLANT
GOWN STRL REUS W/ TWL LRG LVL3 (GOWN DISPOSABLE) ×4 IMPLANT
GOWN STRL REUS W/TWL LRG LVL3 (GOWN DISPOSABLE) ×8
HOLDER FOLEY CATH W/STRAP (MISCELLANEOUS) ×2 IMPLANT
IRRIG SUCT STRYKERFLOW 2 WTIP (MISCELLANEOUS) ×2
IRRIGATION SUCT STRKRFLW 2 WTP (MISCELLANEOUS) ×1 IMPLANT
KIT PROCEDURE DA VINCI SI (MISCELLANEOUS)
KIT PROCEDURE DVNC SI (MISCELLANEOUS) IMPLANT
KIT TURNOVER KIT A (KITS) ×2 IMPLANT
MANIPULATOR UTERINE 4.5 ZUMI (MISCELLANEOUS) ×2 IMPLANT
NEEDLE HYPO 21X1.5 SAFETY (NEEDLE) ×2 IMPLANT
NEEDLE SPNL 18GX3.5 QUINCKE PK (NEEDLE) IMPLANT
OBTURATOR OPTICAL STANDARD 8MM (TROCAR) ×2
OBTURATOR OPTICAL STND 8 DVNC (TROCAR) ×1
OBTURATOR OPTICALSTD 8 DVNC (TROCAR) ×1 IMPLANT
PACK ROBOT GYN CUSTOM WL (TRAY / TRAY PROCEDURE) ×2 IMPLANT
PAD POSITIONING PINK XL (MISCELLANEOUS) ×2 IMPLANT
PORT ACCESS TROCAR AIRSEAL 12 (TROCAR) ×1 IMPLANT
PORT ACCESS TROCAR AIRSEAL 5M (TROCAR) ×1
POUCH SPECIMEN RETRIEVAL 10MM (ENDOMECHANICALS) IMPLANT
SCRUB CHG 4% DYNA-HEX 4OZ (MISCELLANEOUS) ×1 IMPLANT
SEAL CANN UNIV 5-8 DVNC XI (MISCELLANEOUS) ×4 IMPLANT
SEAL XI 5MM-8MM UNIVERSAL (MISCELLANEOUS) ×8
SET TRI-LUMEN FLTR TB AIRSEAL (TUBING) ×2 IMPLANT
SPONGE LAP 18X18 RF (DISPOSABLE) IMPLANT
SURGIFLO W/THROMBIN 8M KIT (HEMOSTASIS) ×2 IMPLANT
SUT MNCRL AB 4-0 PS2 18 (SUTURE) IMPLANT
SUT PDS AB 1 TP1 96 (SUTURE) ×2 IMPLANT
SUT VIC AB 0 CT1 27 (SUTURE)
SUT VIC AB 0 CT1 27XBRD ANTBC (SUTURE) IMPLANT
SUT VIC AB 2-0 CT1 27 (SUTURE)
SUT VIC AB 2-0 CT1 TAPERPNT 27 (SUTURE) IMPLANT
SUT VIC AB 2-0 SH 27 (SUTURE) ×4
SUT VIC AB 2-0 SH 27X BRD (SUTURE) ×2 IMPLANT
SUT VIC AB 4-0 PS2 18 (SUTURE) ×4 IMPLANT
SYR 10ML LL (SYRINGE) IMPLANT
SYR BULB IRRIG 60ML STRL (SYRINGE) ×2 IMPLANT
TOWEL OR NON WOVEN STRL DISP B (DISPOSABLE) ×2 IMPLANT
TRAP SPECIMEN MUCUS 40CC (MISCELLANEOUS) IMPLANT
TRAY FOLEY MTR SLVR 16FR STAT (SET/KITS/TRAYS/PACK) ×2 IMPLANT
TROCAR XCEL NON-BLD 5MMX100MML (ENDOMECHANICALS) IMPLANT
UNDERPAD 30X36 HEAVY ABSORB (UNDERPADS AND DIAPERS) ×2 IMPLANT
WATER STERILE IRR 1000ML POUR (IV SOLUTION) ×2 IMPLANT
YANKAUER SUCT BULB TIP NO VENT (SUCTIONS) IMPLANT

## 2020-12-17 NOTE — Anesthesia Preprocedure Evaluation (Addendum)
Anesthesia Evaluation  Patient identified by MRN, date of birth, ID band Patient awake    Reviewed: Allergy & Precautions, H&P , NPO status , Patient's Chart, lab work & pertinent test results, reviewed documented beta blocker date and time   Airway Mallampati: I  TM Distance: >3 FB Neck ROM: full    Dental no notable dental hx. (+) Poor Dentition, Missing, Dental Advisory Given,    Pulmonary asthma , COPD, former smoker,    Pulmonary exam normal breath sounds clear to auscultation       Cardiovascular Exercise Tolerance: Good hypertension, negative cardio ROS   Rhythm:regular Rate:Normal     Neuro/Psych negative neurological ROS  negative psych ROS   GI/Hepatic Neg liver ROS, GERD  Medicated and Controlled,  Endo/Other  negative endocrine ROS  Renal/GU negative Renal ROS  negative genitourinary   Musculoskeletal  (+) Fibromyalgia -  Abdominal   Peds  Hematology negative hematology ROS (+)   Anesthesia Other Findings   Reproductive/Obstetrics negative OB ROS                            Anesthesia Physical Anesthesia Plan  ASA: III  Anesthesia Plan: General   Post-op Pain Management:    Induction:   PONV Risk Score and Plan: 3 and Ondansetron, Dexamethasone and Treatment may vary due to age or medical condition  Airway Management Planned: Oral ETT  Additional Equipment: None  Intra-op Plan:   Post-operative Plan: Extubation in OR  Informed Consent: I have reviewed the patients History and Physical, chart, labs and discussed the procedure including the risks, benefits and alternatives for the proposed anesthesia with the patient or authorized representative who has indicated his/her understanding and acceptance.     Dental Advisory Given  Plan Discussed with: CRNA and Anesthesiologist  Anesthesia Plan Comments:         Anesthesia Quick Evaluation

## 2020-12-17 NOTE — Anesthesia Postprocedure Evaluation (Signed)
Anesthesia Post Note  Patient: [place]  Procedure(s) Performed: XI ROBOTIC ASSISTED TOTAL HYSTERECTOMY WITH BILATERAL SALPINGO OOPHORECTOMY AND MINI LAPAROTOMY WITH OMENTECTOMY (N/A )     Patient location during evaluation: PACU Anesthesia Type: General Level of consciousness: awake and alert Pain management: pain level controlled Vital Signs Assessment: post-procedure vital signs reviewed and stable Respiratory status: spontaneous breathing, nonlabored ventilation, respiratory function stable and patient connected to nasal cannula oxygen Cardiovascular status: blood pressure returned to baseline and stable Postop Assessment: no apparent nausea or vomiting Anesthetic complications: no   No complications documented.  Last Vitals:  Vitals:   12/17/20 1930 12/17/20 1954  BP: 135/65 (!) 146/66  Pulse: 64 67  Resp: 12   Temp: (!) 36.3 C (!) 36.4 C  SpO2: 100% 100%    Last Pain:  Vitals:   12/17/20 1957  TempSrc:   PainSc: 0-No pain                 Paullette Mckain

## 2020-12-17 NOTE — Transfer of Care (Signed)
Immediate Anesthesia Transfer of Care Note  Patient: Tracey Ferrell  Procedure(s) Performed: XI ROBOTIC ASSISTED TOTAL HYSTERECTOMY WITH BILATERAL SALPINGO OOPHORECTOMY AND MINI LAPAROTOMY WITH OMENTECTOMY (N/A )  Patient Location: PACU  Anesthesia Type:General  Level of Consciousness: awake, alert , oriented and patient cooperative  Airway & Oxygen Therapy: Patient Spontanous Breathing and Patient connected to face mask oxygen  Post-op Assessment: Report given to RN, Post -op Vital signs reviewed and stable and Patient moving all extremities X 4  Post vital signs: stable  Last Vitals:  Vitals Value Taken Time  BP 154/69 12/17/20 1845  Temp 36.4 C 12/17/20 1845  Pulse 77 12/17/20 1849  Resp 11 12/17/20 1849  SpO2 100 % 12/17/20 1849  Vitals shown include unvalidated device data.  Last Pain:  Vitals:   12/17/20 1837  TempSrc:   PainSc: 0-No pain         Complications: No complications documented.

## 2020-12-17 NOTE — Anesthesia Procedure Notes (Signed)
Procedure Name: Intubation Date/Time: 12/17/2020 3:53 PM Performed by: Mitzie Na, CRNA Pre-anesthesia Checklist: Patient identified, Emergency Drugs available, Suction available and Patient being monitored Patient Re-evaluated:Patient Re-evaluated prior to induction Oxygen Delivery Method: Circle system utilized Preoxygenation: Pre-oxygenation with 100% oxygen Induction Type: IV induction, Rapid sequence and Cricoid Pressure applied Laryngoscope Size: Miller and 2 Grade View: Grade I Tube type: Oral Tube size: 7.0 mm Number of attempts: 1 Airway Equipment and Method: Stylet Placement Confirmation: ETT inserted through vocal cords under direct vision,  positive ETCO2 and breath sounds checked- equal and bilateral Secured at: 21 cm Tube secured with: Tape Dental Injury: Teeth and Oropharynx as per pre-operative assessment

## 2020-12-17 NOTE — Brief Op Note (Signed)
12/17/2020  6:16 PM  PATIENT:  Tracey Ferrell  70 y.o. female  PRE-OPERATIVE DIAGNOSIS:  PELVIC MASS  POST-OPERATIVE DIAGNOSIS:  PELVIC MASS  PROCEDURE:  Procedure(s): XI ROBOTIC ASSISTED TOTAL HYSTERECTOMY WITH BILATERAL SALPINGO OOPHORECTOMY AND MINI LAPAROTOMY WITH OMENTECTOMY (N/A)  SURGEON:  Surgeon(s) and Role:    Lafonda Mosses, MD - Primary    * Lahoma Crocker, MD - Assisting  ANESTHESIA:   general  EBL:  75 mL   BLOOD ADMINISTERED:none  DRAINS: none   LOCAL MEDICATIONS USED:  MARCAINE     SPECIMEN:  Uterus, cervix, bilateral tubes and ovaries, omentectomy  DISPOSITION OF SPECIMEN:  PATHOLOGY  COUNTS:  YES  TOURNIQUET:  * No tourniquets in log *  DICTATION: .Note written in EPIC  PLAN OF CARE: Admit for overnight observation  PATIENT DISPOSITION:  PACU - hemodynamically stable.   Delay start of Pharmacological VTE agent (>24hrs) due to surgical blood loss or risk of bleeding: no

## 2020-12-17 NOTE — Op Note (Signed)
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  Joylene John NP  Anesthesia: GET  Urine Output: 200cc  Operative Findings: On EUA, large smooth mass filling the pelvic and much of the upper abdomen; small uterus. 8L of greenish-brown fluid drained in a controlled manner from cyst. On laparoscopy, normal upper abdominal survey. Right decompressed ovary somewhat retroperitoneal on the right side. Mass smooth. Left ovary somewhat retroperitoneal, normal appearing. Uterus 4-6cm and normal. Some adhesions between the bladder and the cervix on the right. No intra-abdominal or pelvic evidence of disease.   Estimated Blood Loss: 75cc  Total IV Fluids: see I&O flowsheet         Specimens: uterus, cervix, bilateral tubes and ovaries, omentum, pelvic washings 8L drained from cyst         Complications:  None apparent; patient tolerated the procedure well.         Disposition: PACU - hemodynamically stable.  Procedure Details  The patient was seen in the Holding Room. The risks, benefits, complications, treatment options, and expected outcomes were discussed with the patient.  The patient concurred with the proposed plan, giving informed consent.  The site of surgery properly noted/marked. The patient was identified as Tracey Ferrell and the procedure verified as a Robotic-assisted hysterectomy with bilateral salpingo oophorectomy, mini-lap, possible staging.   After induction of  anesthesia, the patient was draped and prepped in the usual sterile manner. Patient was placed in supine position after anesthesia and draped and prepped in the usual sterile manner as follows: Her arms were tucked to her side with all appropriate precautions.  The shoulders were stabilized with padded shoulder blocks applied to the acromium processes.  The patient was placed in the semi-lithotomy position in [place].  The perineum and vagina were prepped with CholoraPrep. The patient was draped after the CholoraPrep had been allowed to dry for 3 minutes.  A Time Out was held and the above information confirmed.  The urethra was prepped with Betadine. Foley catheter was placed.  A sterile speculum was placed in the vagina.  The cervix was grasped with a single-tooth tenaculum. The cervix was dilated but unable to find a true passage into the uterus. Medium KOH ring in the vagina and up against the cervix for manipulation.  A 4-5cm incision was made superior to the umbilicus with a scalpel and taken down to the fascia and peritoneum with electrocautery. Peritoneum was opened sharply. The incision was extended to the length of the skin incision. An alexis retractor was placed. Pelvic washings were collected. The gallbladder trocar was then used to perform a contained drainage of 8L of the right ovarian cyst. A 2-0 Vicryl was was then used to close the drainage incision with a figure of eight. The laparoscopic cap was place on the [place] and a robotic trocar inserted through the central opening. The abdomen was insufflated to 50mm Hg.  Under direct visualization, a 10 mm skin incision was made 1 cm below the subcostal margin in the midclavicular line. A 10-12 port was placed.  At this point and all points during the procedure, the  patient's intra-abdominal pressure did not exceed 15 mmHg.  A right and left port were placed about 8 cm lateral to the robot port on the right and left side. All ports were  placed under direct visualization.  The patient was placed in steep Trendelenburg.  The robot was docked in the normal manner.  The right and left peritoneum were opened parallel to the IP ligament to open the retroperitoneal spaces bilaterally. The round ligaments were transected. The ureter was noted to be on the medial leaf of the broad ligament.  The peritoneum above the ureter was incised and stretched and the infundibulopelvic ligament was skeletonized, cauterized and cut.   The posterior peritoneum was taken down to the level of the KOH ring while traction was used to place upward pressure on the ring.  The anterior peritoneum was also taken down.  The bladder flap was created to the level of the KOH ring.  The uterine artery on the right side was skeletonized, cauterized and cut in the normal manner.  A similar procedure was performed on the left.  The colpotomy was made and the uterus, cervix, bilateral ovaries and tubes were amputated.  Pedicles were inspected and excellent hemostasis was achieved.    The specimen, including uterus, cervix, and bilateral adnexa was removed through the mini-lap after the robotic instruments were removed and the arms undocked. Once handed off the field, the abdomen was insufflated again, robot docked.   The colpotomy at the vaginal cuff was closed with Vicryl on a CT1 needle in a running manner.  Irrigation was used and excellent hemostasis was achieved.  Flow seal was used along the right aspect of the pelvic sidewall given raw surface from dissection of the cyst from the sidewall. At this point in the procedure was completed.  Robotic instruments were removed under direct visulaization.  The robot was undocked.   The omentum was delivered through the incision. No borderline features seen on frozen section, but given massive size of the specimen, decision made to perform omentectomy in the event final pathology is borderline. An infracolic omentectomy was  performed with a combination of monopolar electrocautery and free ties of 2-0 Vicryl.   The fascia of the mini-lap was closed with 0 looped PDS tied at the apex of the incision. The subcutaneous tissue was irrigated and hemostasis assured. Local anesthesia was injected. The fascia at the 10-12 mm port was closed with 0 Vicryl on a UR-5 needle.  The subcuticular tissue of all incisions was closed with 4-0 Vicryl and the skin was closed with 4-0 Monocryl in a subcuticular manner.  Dermabond was applied.    The vagina was swabbed with  minimal bleeding noted.   All sponge, lap and needle counts were correct x  3.   The patient was transferred to the recovery room in stable condition.  Jeral Pinch, MD

## 2020-12-17 NOTE — Interval H&P Note (Signed)
History and Physical Interval Note:  12/17/2020 10:40 AM  Tracey Ferrell  has presented today for surgery, with the diagnosis of PELVIC MASS.  The various methods of treatment have been discussed with the patient and family. After consideration of risks, benefits and other options for treatment, the patient has consented to  Procedure(s): XI ROBOTIC ASSISTED BILATERAL SALPINGO OOPHORECTOMY AND MINI LAPAROTOMY (N/A) POSSIBLE XI ROBOTIC ASSISTED TOTAL HYSTERECTOMY (N/A) POSSIBLE LAPAROTOMY WITH STAGING (N/A) as a surgical intervention.  The patient's history has been reviewed, patient examined, no change in status, stable for surgery.  I have reviewed the patient's chart and labs.  Questions were answered to the patient's satisfaction.     Tracey Ferrell

## 2020-12-18 ENCOUNTER — Other Ambulatory Visit: Payer: Self-pay | Admitting: Gynecologic Oncology

## 2020-12-18 ENCOUNTER — Encounter (HOSPITAL_COMMUNITY): Payer: Self-pay | Admitting: Gynecologic Oncology

## 2020-12-18 ENCOUNTER — Other Ambulatory Visit (HOSPITAL_COMMUNITY): Payer: Self-pay | Admitting: Gynecologic Oncology

## 2020-12-18 DIAGNOSIS — Z20822 Contact with and (suspected) exposure to covid-19: Secondary | ICD-10-CM | POA: Diagnosis present

## 2020-12-18 DIAGNOSIS — N83299 Other ovarian cyst, unspecified side: Secondary | ICD-10-CM

## 2020-12-18 DIAGNOSIS — K5900 Constipation, unspecified: Secondary | ICD-10-CM | POA: Diagnosis present

## 2020-12-18 DIAGNOSIS — Z6823 Body mass index (BMI) 23.0-23.9, adult: Secondary | ICD-10-CM | POA: Diagnosis not present

## 2020-12-18 DIAGNOSIS — I10 Essential (primary) hypertension: Secondary | ICD-10-CM | POA: Diagnosis present

## 2020-12-18 DIAGNOSIS — F32A Depression, unspecified: Secondary | ICD-10-CM | POA: Diagnosis present

## 2020-12-18 DIAGNOSIS — J449 Chronic obstructive pulmonary disease, unspecified: Secondary | ICD-10-CM | POA: Diagnosis present

## 2020-12-18 DIAGNOSIS — E785 Hyperlipidemia, unspecified: Secondary | ICD-10-CM | POA: Diagnosis present

## 2020-12-18 DIAGNOSIS — N83209 Unspecified ovarian cyst, unspecified side: Secondary | ICD-10-CM | POA: Diagnosis present

## 2020-12-18 DIAGNOSIS — R7303 Prediabetes: Secondary | ICD-10-CM | POA: Diagnosis present

## 2020-12-18 DIAGNOSIS — R634 Abnormal weight loss: Secondary | ICD-10-CM | POA: Diagnosis present

## 2020-12-18 DIAGNOSIS — N83201 Unspecified ovarian cyst, right side: Secondary | ICD-10-CM | POA: Diagnosis present

## 2020-12-18 DIAGNOSIS — R1900 Intra-abdominal and pelvic swelling, mass and lump, unspecified site: Secondary | ICD-10-CM | POA: Diagnosis present

## 2020-12-18 DIAGNOSIS — M797 Fibromyalgia: Secondary | ICD-10-CM | POA: Diagnosis present

## 2020-12-18 DIAGNOSIS — R6881 Early satiety: Secondary | ICD-10-CM | POA: Diagnosis present

## 2020-12-18 DIAGNOSIS — D399 Neoplasm of uncertain behavior of female genital organ, unspecified: Secondary | ICD-10-CM

## 2020-12-18 DIAGNOSIS — Z87891 Personal history of nicotine dependence: Secondary | ICD-10-CM | POA: Diagnosis not present

## 2020-12-18 DIAGNOSIS — K219 Gastro-esophageal reflux disease without esophagitis: Secondary | ICD-10-CM | POA: Diagnosis present

## 2020-12-18 LAB — CBC
HCT: 36.1 % (ref 36.0–46.0)
Hemoglobin: 11.8 g/dL — ABNORMAL LOW (ref 12.0–15.0)
MCH: 30 pg (ref 26.0–34.0)
MCHC: 32.7 g/dL (ref 30.0–36.0)
MCV: 91.9 fL (ref 80.0–100.0)
Platelets: 295 10*3/uL (ref 150–400)
RBC: 3.93 MIL/uL (ref 3.87–5.11)
RDW: 12.6 % (ref 11.5–15.5)
WBC: 7.4 10*3/uL (ref 4.0–10.5)
nRBC: 0 % (ref 0.0–0.2)

## 2020-12-18 LAB — CEA: CEA: 1.4 ng/mL (ref 0.0–4.7)

## 2020-12-18 LAB — BASIC METABOLIC PANEL
Anion gap: 7 (ref 5–15)
BUN: 9 mg/dL (ref 8–23)
CO2: 25 mmol/L (ref 22–32)
Calcium: 8.6 mg/dL — ABNORMAL LOW (ref 8.9–10.3)
Chloride: 103 mmol/L (ref 98–111)
Creatinine, Ser: 0.72 mg/dL (ref 0.44–1.00)
GFR, Estimated: >60 mL/min (ref >60)
Glucose, Bld: 189 mg/dL — ABNORMAL HIGH (ref 70–99)
Potassium: 4.3 mmol/L (ref 3.5–5.1)
Sodium: 135 mmol/L (ref 135–145)

## 2020-12-18 LAB — CA 125: Cancer Antigen (CA) 125: 148 U/mL — ABNORMAL HIGH (ref 0.0–38.1)

## 2020-12-18 MED ORDER — TRAMADOL HCL 50 MG PO TABS: 50.0000 mg | ORAL_TABLET | Freq: Four times a day (QID) | ORAL | 0 refills | Status: DC | PRN

## 2020-12-18 MED ORDER — CHLORHEXIDINE GLUCONATE CLOTH 2 % EX PADS
6.0000 | MEDICATED_PAD | Freq: Every day | CUTANEOUS | Status: DC
  Administered 2020-12-18 – 2020-12-19 (×2): 6 via TOPICAL

## 2020-12-18 MED ORDER — SENNOSIDES-DOCUSATE SODIUM 8.6-50 MG PO TABS: 2.0000 | ORAL_TABLET | Freq: Every day | ORAL | 0 refills | Status: DC

## 2020-12-18 MED ORDER — IBUPROFEN 600 MG PO TABS: 600.0000 mg | ORAL_TABLET | Freq: Four times a day (QID) | ORAL | 0 refills | Status: DC | PRN

## 2020-12-18 MED FILL — traMADol HCL 50 MG TABS: 50 | 2 days supply | Qty: 10 | Fill #0

## 2020-12-18 MED FILL — IBUPROFEN 600 MG TABLET: 600 | 7 days supply | Qty: 30 | Fill #0

## 2020-12-18 NOTE — Discharge Instructions (Signed)
12/18/2020  Return to work: 4-6 weeks if applicable  You will have a white honeycomb dressing over your larger incision. This dressing can be removed 5 days after surgery and you do not need to reapply a new dressing. Once you remove the dressing, you will notice that you have the surgical glue (dermabond) on the incision and this will peel off on its own. You can get this dressing wet in the shower the days after surgery prior to removal on the 5th day.  Activity: 1. Be up and out of the bed during the day.  Take a nap if needed.  You may walk up steps but be careful and use the hand rail.  Stair climbing will tire you more than you think, you may need to stop part way and rest.   2. No lifting or straining for 6 weeks.  3. No driving for 1-2 week(s) if you were cleared to drive before surgery.  Do not drive if you are taking narcotic pain medicine.  4. Shower daily.  Use soap and water on your incision and pat dry; don't rub.  No tub baths until cleared by your surgeon.   5. No sexual activity and nothing in the vagina for 8 weeks.  6. You may experience a small amount of clear drainage from your incisions, which is normal.  If the drainage persists or increases, please call the office.  7. You may experience vaginal spotting after surgery or around the 6-8 week mark from surgery when the stitches at the top of the vagina begin to dissolve.  The spotting is normal but if you experience heavy bleeding, call our office.  8. Take Tylenol or ibuprofen first for pain and only use narcotic pain medication for severe pain not relieved by the Tylenol or Ibuprofen.  Monitor your Tylenol intake to a max of 4,000 mg.  Diet: 1. Low sodium Heart Healthy Diet is recommended.  2. It is safe to use a laxative, such as Miralax or Colace, if you have difficulty moving your bowels. You can take Sennakot at bedtime every evening to keep bowel movements regular and to prevent constipation.    Wound Care: 1.  Keep clean and dry.  Shower daily.  Reasons to call the Doctor:  Fever - Oral temperature greater than 100.4 degrees Fahrenheit  Foul-smelling vaginal discharge  Difficulty urinating  Nausea and vomiting  Increased pain at the site of the incision that is unrelieved with pain medicine.  Difficulty breathing with or without chest pain  New calf pain especially if only on one side  Sudden, continuing increased vaginal bleeding with or without clots.   Contacts: For questions or concerns you should contact:  Dr. Jeral Pinch at [phone]  Joylene John, NP at [phone]  After Hours: call [phone] and have the GYN Oncologist paged/contacted

## 2020-12-18 NOTE — Progress Notes (Signed)
1 Day Post-Op Procedure(s) (LRB): XI ROBOTIC ASSISTED TOTAL HYSTERECTOMY WITH BILATERAL SALPINGO OOPHORECTOMY AND MINI LAPAROTOMY WITH OMENTECTOMY (N/A)  Subjective: Patient reports minimal incisional pain. Tolerating diet with no nausea or emesis. States her appetite has returned. She has ambulated once since surgery and states she felt weak and like she was going to collapse. Denies chest pain, dyspnea, lightheadedness. No flatus reported. No concerns voiced.   Objective: Vital signs in last 24 hours: Temp:  [94.4 F (34.7 C)-99.5 F (37.5 C)] 98.1 F (36.7 C) (02/18 1100) Pulse Rate:  [55-96] 58 (02/18 1100) Resp:  [12-18] 16 (02/18 1100) BP: (101-193)/(50-81) 111/52 (02/18 1100) SpO2:  [97 %-100 %] 98 % (02/18 1100) Weight:  [111 lb 3.2 oz (50.4 kg)] 111 lb 3.2 oz (50.4 kg) (02/17 1207) Last BM Date: 12/17/20  Intake/Output from previous day: 02/17 0701 - 02/18 0700 In: 3069.5 [P.O.:120; I.V.:2949.5] Out: 48185 [Urine:2300; Blood:75]  Physical Examination: General: alert, cooperative and no distress Resp: clear to auscultation bilaterally Cardio: regular rate and rhythm, S1, S2 normal, no murmur, click, rub or gallop GI: incision: lap sites to the abdomen with dermadbond intact without active drainage, mini laparotomy incision midline intact with op site dressing intact with no active drainage noted underneath dressing and abdomen soft, active bowel sounds, slightly rounded in lower abdomen, non-tympanic Extremities: extremities normal, atraumatic, no cyanosis or edema  Foley in place with clear, yellow urine  Labs: WBC/Hgb/Hct/Plts:  7.4/11.8/36.1/295 (02/18 0417) BUN/Cr/glu/ALT/AST/amyl/lip:  9/0.72/--/--/--/--/-- (02/18 0417)  Assessment: 70 y.o. s/p Procedure(s): XI ROBOTIC ASSISTED TOTAL HYSTERECTOMY WITH BILATERAL SALPINGO OOPHORECTOMY AND MINI LAPAROTOMY WITH OMENTECTOMY: stable Pain:  Pain is well-controlled on PRN medications.  Heme: Hgb 11.8 and Hct 36.1 this am.  Appropriate compared with pre-op values and surgical losses.  CV: Hypotensive at times-symptomatic when out of bed. HR stable. Plan for BP re-evaluation with orthostatic vitals.  GI:  Tolerating po: Yes. Antiemetics ordered if needed.  GU: Foley with adequate output. Plan for removal. Creatinine 0.72   FEN: No critical values noted.  Prophylaxis: SCDs and lovenox ordered.  Plan: Discontinue foley Orthostatic vitals now Diet as tolerated Encourage ambulation with assist, IS use Kpad Continue plan of care per Dr. Berline Lopes Plan for probable discharge in the am Discharge medications (tramadol, ibuprofen, and sennakot-S) given to the patient. Patient instructed on use and a colored dot system used to help patient delineate the medications since she is unable to read english. Medication instructions also translated into [place].   LOS: 0 days    Dorothyann Gibbs 12/18/2020, 11:15 AM

## 2020-12-19 LAB — CBC
HCT: 35.2 % — ABNORMAL LOW (ref 36.0–46.0)
Hemoglobin: 11.2 g/dL — ABNORMAL LOW (ref 12.0–15.0)
MCH: 30.4 pg (ref 26.0–34.0)
MCHC: 31.8 g/dL (ref 30.0–36.0)
MCV: 95.4 fL (ref 80.0–100.0)
Platelets: 279 10*3/uL (ref 150–400)
RBC: 3.69 MIL/uL — ABNORMAL LOW (ref 3.87–5.11)
RDW: 12.9 % (ref 11.5–15.5)
WBC: 8.7 10*3/uL (ref 4.0–10.5)
nRBC: 0 % (ref 0.0–0.2)

## 2020-12-19 LAB — BASIC METABOLIC PANEL
Anion gap: 8 (ref 5–15)
BUN: 11 mg/dL (ref 8–23)
CO2: 26 mmol/L (ref 22–32)
Calcium: 8.5 mg/dL — ABNORMAL LOW (ref 8.9–10.3)
Chloride: 106 mmol/L (ref 98–111)
Creatinine, Ser: 0.67 mg/dL (ref 0.44–1.00)
GFR, Estimated: >60 mL/min (ref >60)
Glucose, Bld: 108 mg/dL — ABNORMAL HIGH (ref 70–99)
Potassium: 3.7 mmol/L (ref 3.5–5.1)
Sodium: 140 mmol/L (ref 135–145)

## 2020-12-19 MED ORDER — GLYCERIN (LAXATIVE) 2.1 G RE SUPP
1.0000 | Freq: Once | RECTAL | Status: AC
  Administered 2020-12-19: 1 via RECTAL
  Filled 2020-12-19: qty 1

## 2020-12-19 MED ORDER — SIMETHICONE 80 MG PO CHEW
80.0000 mg | CHEWABLE_TABLET | Freq: Four times a day (QID) | ORAL | Status: DC
  Administered 2020-12-19 (×2): 80 mg via ORAL
  Filled 2020-12-19 (×2): qty 1

## 2020-12-19 MED ORDER — ENSURE ENLIVE PO LIQD
237.0000 mL | Freq: Two times a day (BID) | ORAL | Status: DC
  Administered 2020-12-19 (×2): 237 mL via ORAL

## 2020-12-19 NOTE — Progress Notes (Signed)
2 Days Post-Op Procedure(s) (LRB): XI ROBOTIC ASSISTED TOTAL HYSTERECTOMY WITH BILATERAL SALPINGO OOPHORECTOMY AND MINI LAPAROTOMY WITH OMENTECTOMY (N/A)  Subjective: C/O gas pain, decreased appetite.  No N/V/BM  Objective: Vital signs in last 24 hours: Temp:  [98.1 F (36.7 C)-98.7 F (37.1 C)] 98.4 F (36.9 C) (02/19 0534) Pulse Rate:  [59-71] 68 (02/19 0534) Resp:  [16] 16 (02/19 0534) BP: (111-141)/(52-65) 141/56 (02/19 0534) SpO2:  [93 %-98 %] 93 % (02/19 0534) Last BM Date: 12/17/20  Intake/Output from previous day: 02/18 0701 - 02/19 0700 In: 1463 [P.O.:474; I.V.:989] Out: 2720 [Urine:2720]  Physical Examination: General: alert, cooperative and no distress Resp: clear to auscultation bilaterally Cardio: regular rate and rhythm, S1, S2 normal, no murmur, click, rub or gallop GI: incision: lap sites to the abdomen with dermadbond intact without active drainage, mini laparotomy incision midline intact with op site dressing intact with no active drainage noted underneath dressing and abdomen soft, active bowel sounds,  Extremities: extremities normal, atraumatic, no cyanosis or edema  Foley in place with clear, yellow urine  Labs: WBC/Hgb/Hct/Plts:  8.7/11.2/35.2/279 (02/19 0435) BUN/Cr/glu/ALT/AST/amyl/lip:  11/0.67/--/--/--/--/-- (02/19 0435)  Assessment: 70 y.o. s/p Procedure(s): XI ROBOTIC ASSISTED TOTAL HYSTERECTOMY WITH BILATERAL SALPINGO OOPHORECTOMY AND MINI LAPAROTOMY WITH OMENTECTOMY: stable Pain:  Pain is well-controlled on PRN medications.  CV: B/Ps in range  GI:  Tolerating po: Yes. Suspect decreased appetite 2/2 gas pain s/p removal of large abdominal mass   FEN: Electrolytes in range  Prophylaxis: SCDs and lovenox ordered.  Plan: Rectal suppository, antiflatulent Diet as tolerated, add nutritional supplement Encourage ambulation with assist, IS use Plan for probable discharge this pm SW consult to arrange transportation    LOS: 1 day    Lahoma Crocker, MD 12/19/2020, 10:47 AM

## 2020-12-19 NOTE — TOC Progression Note (Signed)
Transition of Care [facility]) - Progression Note    Patient Details  Name: Tracey Ferrell MRN: [id number] Date of Birth: 22-Feb-1951  [facility] CM/SW [place], [place] Phone Number: 12/19/2020, 3:50 PM  Clinical Narrative:    [place] Voucher provided to transport the patient home.     Barriers to Discharge: No Barriers Identified  Expected Discharge Plan and Services           Expected Discharge Date: 12/19/20                                    Social Determinants of Health (SDOH) Interventions    Readmission Risk Interventions No flowsheet data found.

## 2020-12-20 NOTE — Discharge Summary (Signed)
Physician Discharge Summary  Patient ID: Tracey Ferrell MRN: [id number] DOB/AGE: 1951-01-01 70 y.o.  Admit date: 12/17/2020 Discharge date: 12/20/2020  Admission Diagnoses: Active Problems:   Pelvic mass in female   Ovarian cyst  Discharge Diagnoses:  Active Problems:   Pelvic mass in female   Ovarian cyst   Discharged Condition: stable  Hospital Course: On 12/17/2020, the patient underwent the following: Procedure(s): XI ROBOTIC ASSISTED TOTAL HYSTERECTOMY WITH BILATERAL SALPINGO OOPHORECTOMY AND MINI LAPAROTOMY WITH OMENTECTOMY.   The postoperative course was uneventful.  She was discharged to home on postoperative day 2 meeting goals for discharge.  Consults: None  Significant Diagnostic Studies: None  Treatments: See above Discharge Exam: Blood pressure 127/60, pulse 79, temperature 98.4 F (36.9 C), temperature source Oral, resp. rate 16, height 4\' 10"  (1.473 m), weight 50.4 kg, SpO2 92 %. General appearance: alert GI: soft, non-tender; bowel sounds normal; no masses,  no organomegaly Extremities: Homans sign is negative, no sign of DVT Incision/Wound: C/DI  Disposition: Discharge disposition: 01-Home or Self Care        Allergies as of 12/19/2020   No Known Allergies     Medication List    TAKE these medications   docusate sodium 100 MG capsule Commonly known as: COLACE Take 1 capsule (100 mg total) by mouth every 12 (twelve) hours.   ibuprofen 600 MG tablet Commonly known as: ADVIL Take 1 tablet (600 mg total) by mouth every 6 (six) hours as needed for moderate pain. For AFTER surgery only   omega-3 acid ethyl esters 1 g capsule Commonly known as: LOVAZA Take 1,200 mg by mouth daily.   senna-docusate 8.6-50 MG tablet Commonly known as: Senokot-S Take 2 tablets by mouth at bedtime. For AFTER surgery, do not take if having diarrhea   sucralfate 1 g tablet Commonly known as: Carafate Take 1 tablet (1 g total) by mouth 4 (four) times daily as  needed.   traMADol 50 MG tablet Commonly known as: ULTRAM Take 1 tablet (50 mg total) by mouth every 6 (six) hours as needed for severe pain. For AFTER surgery only, do not take and drive   Vitamin D3 50 MCG (2000 UT) Tabs Take 2,000 mg by mouth daily.       Follow-up Information    Lafonda Mosses, MD Follow up on 01/07/2021.   Specialty: Gynecologic Oncology Why: at 3pm at the [facility] information: [place] [address] [phone]               Signed: Lahoma Crocker 12/20/2020, 1:40 PM

## 2020-12-21 ENCOUNTER — Telehealth: Payer: Self-pay

## 2020-12-21 LAB — CYTOLOGY - NON PAP

## 2020-12-21 LAB — SURGICAL PATHOLOGY

## 2020-12-21 NOTE — Telephone Encounter (Signed)
Tracey Ferrell states that she is eating,drinking, and urinating well. She does not eat a lot of food. She usually eats 1 time a day maybe twice  Passing gas.She took 1 senokot tablet last evening.  Told her to increase the senokot-s (Yellow dotted Medication) to 2 tabs bid with 8 ounces of water until good BM.If she does not have a good BM by Thursday 12-24-20 mid day to call the office for further instructions. Afebrile. Incisions D&I Told her she can take the honeycomb dressing off tomorrow.  Pt wants to keep it on a couple of more days to be sure incision ok.  Told her to take it off by Thursday 12-24-20 and leave open to the air. Pt verbalized understanding. Pain level currently 5/10.  She took an Ibuprofen 600 mg tab( Green dotted medication)this am.  She has used the tramadol 50 mg tablet( Red dotted medication) twice since she has been home.  Suggested 1/2 tab since it makes her feel drugged.   Suggested she take the green dotted medication every six hours as prescribed. This may help the abdominal pain better if used more regularly.  Pt verbalized understanding.  Pt aware of post op appointment as well as the office number [phone] and after hours number to call if she has any questions or concerns

## 2020-12-22 ENCOUNTER — Telehealth: Payer: Self-pay | Admitting: Gynecologic Oncology

## 2020-12-22 NOTE — Telephone Encounter (Signed)
Called patient and reviewed pathology from surgery. She is very happy to hear this news. Doing well from a post-op standpoint. Aware of date/time of follow-up appointment.  Jeral Pinch MD Gynecologic Oncology

## 2021-01-06 ENCOUNTER — Telehealth: Payer: Self-pay

## 2021-01-06 NOTE — Telephone Encounter (Signed)
TC to review [facility] information for 01/07/2021 visit with Dr. Berline Lopes. No answer, voicemail is full and unable to accept new message.

## 2021-01-07 ENCOUNTER — Other Ambulatory Visit: Payer: Self-pay

## 2021-01-07 ENCOUNTER — Inpatient Hospital Stay: Payer: Medicare Other | Attending: Gynecologic Oncology | Admitting: Gynecologic Oncology

## 2021-01-07 ENCOUNTER — Encounter: Payer: Self-pay | Admitting: Gynecologic Oncology

## 2021-01-07 VITALS — BP 138/70 | HR 64 | Temp 96.9°F | Resp 20 | Wt 95.0 lb

## 2021-01-07 DIAGNOSIS — D270 Benign neoplasm of right ovary: Secondary | ICD-10-CM

## 2021-01-07 DIAGNOSIS — Z90722 Acquired absence of ovaries, bilateral: Secondary | ICD-10-CM | POA: Diagnosis not present

## 2021-01-07 DIAGNOSIS — D259 Leiomyoma of uterus, unspecified: Secondary | ICD-10-CM

## 2021-01-07 DIAGNOSIS — Z90710 Acquired absence of both cervix and uterus: Secondary | ICD-10-CM | POA: Diagnosis not present

## 2021-01-07 DIAGNOSIS — N83299 Other ovarian cyst, unspecified side: Secondary | ICD-10-CM

## 2021-01-07 NOTE — Progress Notes (Signed)
Gynecologic Oncology Return Clinic Visit  01/07/21  Reason for Visit: Postoperative follow-up  Interval History: Patient presents today for follow-up after recent surgery on 2/17.  She underwent robotic hysterectomy and BSO, mini lap for controlled cyst drainage and specimen removal and omentectomy.  Patient did well from a postoperative standpoint was discharged home on postoperative day 2.  She presents today noting minimal pain.  She is tolerating a regular diet without nausea or emesis.  She reports normal bowel and bladder function.  She had minimal spotting right after surgery, denies any vaginal bleeding or discharge since.  She has been walking a lot.  She reports breathing has improved.  Past Medical/Surgical History: Past Medical History:  Diagnosis Date  . Acute respiratory failure with hypoxia ([place]) 04/13/2015  . Asthma   . Back pain   . Borderline diabetic   . Bronchitis, acute 04/13/2015  . COPD (chronic obstructive pulmonary disease) ([place])   . Depression   . Fibromyalgia   . GERD (gastroesophageal reflux disease)   . Hyperlipidemia   . Hypertension   . LOC (loss of consciousness) ([place]) 04/14/2015  . OAB (overactive bladder)   . Ovarian mass   . Polycythemia 04/13/2015  . Shortness of breath dyspnea     Past Surgical History:  Procedure Laterality Date  . COSMETIC SURGERY    . ROBOTIC ASSISTED BILATERAL SALPINGO OOPHERECTOMY N/A 12/17/2020   Procedure: XI ROBOTIC ASSISTED TOTAL HYSTERECTOMY WITH BILATERAL SALPINGO OOPHORECTOMY AND MINI LAPAROTOMY WITH OMENTECTOMY;  Surgeon: Lafonda Mosses, MD;  Location: [facility];  Service: Gynecology;  Laterality: N/A;    History reviewed. No pertinent family history.  Social History   Socioeconomic History  . Marital status: Single    Spouse name: Not on file  . Number of children: 0  . Years of education: Not on file  . Highest education level: Not on file  Occupational History  . Occupation: Environmental education officer:  [employer]  Tobacco Use  . Smoking status: Former Smoker    Packs/day: 0.50    Years: 44.00    Pack years: 22.00    Types: Cigarettes    Quit date: 12/30/2014    Years since quitting: 6.0  . Smokeless tobacco: Never Used  Vaping Use  . Vaping Use: Never used  Substance and Sexual Activity  . Alcohol use: No    Comment: former  . Drug use: No  . Sexual activity: Not Currently  Other Topics Concern  . Not on file  Social History Narrative   Denies need for translator   Social Determinants of Health   Financial Resource Strain: Not on file  Food Insecurity: Not on file  Transportation Needs: Not on file  Physical Activity: Not on file  Stress: Not on file  Social Connections: Not on file    Current Medications:  Current Outpatient Medications:  .  Cholecalciferol (VITAMIN D3) 50 MCG (2000 UT) TABS, Take 2,000 mg by mouth daily., Disp: , Rfl:  .  docusate sodium (COLACE) 100 MG capsule, Take 1 capsule (100 mg total) by mouth every 12 (twelve) hours. (Patient not taking: No sig reported), Disp: 30 capsule, Rfl: 0 .  ibuprofen (ADVIL) 600 MG tablet, Take 1 tablet (600 mg total) by mouth every 6 (six) hours as needed for moderate pain. For AFTER surgery only (Patient not taking: Reported on 01/07/2021), Disp: 30 tablet, Rfl: 0 .  omega-3 acid ethyl esters (LOVAZA) 1 g capsule, Take 1,200 mg by mouth daily. (Patient not  taking: Reported on 01/07/2021), Disp: , Rfl:  .  senna-docusate (SENOKOT-S) 8.6-50 MG tablet, Take 2 tablets by mouth at bedtime. For AFTER surgery, do not take if having diarrhea (Patient not taking: Reported on 01/07/2021), Disp: 30 tablet, Rfl: 0 .  sucralfate (CARAFATE) 1 g tablet, Take 1 tablet (1 g total) by mouth 4 (four) times daily as needed. (Patient not taking: Reported on 01/07/2021), Disp: 30 tablet, Rfl: 0 .  traMADol (ULTRAM) 50 MG tablet, Take 1 tablet (50 mg total) by mouth every 6 (six) hours as needed for severe pain. For AFTER surgery only, do not  take and drive (Patient not taking: Reported on 01/07/2021), Disp: 10 tablet, Rfl: 0  Review of Systems: Denies appetite changes, fevers, chills, fatigue, unexplained weight changes. Denies hearing loss, neck lumps or masses, mouth sores, ringing in ears or voice changes. Denies cough or wheezing.  Denies shortness of breath. Denies chest pain or palpitations. Denies leg swelling. Denies abdominal distention, pain, blood in stools, constipation, diarrhea, nausea, vomiting, or early satiety. Denies pain with intercourse, dysuria, frequency, hematuria or incontinence. Denies hot flashes, pelvic pain, vaginal bleeding or vaginal discharge.   Denies joint pain, back pain or muscle pain/cramps. Denies itching, rash, or wounds. Denies dizziness, headaches, numbness or seizures. Denies swollen lymph nodes or glands, denies easy bruising or bleeding. Denies anxiety, depression, confusion, or decreased concentration.  Physical Exam: BP 138/70 (BP Location: Left Arm, Patient Position: Sitting)   Pulse 64   Temp (!) 96.9 F (36.1 C) (Tympanic)   Resp 20   Wt 95 lb (43.1 kg)   SpO2 100% Comment: RA  BMI 19.86 kg/m  General: Alert, oriented, no acute distress. HEENT: Normocephalic, atraumatic, sclera anicteric. Chest: Unlabored breathing on room air.  Lungs clear to auscultation bilaterally, no wheezes. Cardiovascular: Regular rate and rhythm, no murmurs. Abdomen: soft, nontender.  Normoactive bowel sounds.  No masses or hepatosplenomegaly appreciated.  Well-healing incisions, remaining Dermabond removed. Extremities: Grossly normal range of motion.  Warm, well perfused.  No edema bilaterally. GU: Normal appearing external genitalia without erythema, excoriation, or lesions.  Speculum exam reveals no bleeding or discharge, cuff visually intact, suture visible.  Bimanual exam reveals cuff intact, no fluctuance or tenderness with palpation.    Laboratory & Radiologic Studies: A. 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 OMENTUM:  - Portion of benign omentum with focal fibrosis  - No evidence of malignancy   Assessment & Plan: Tracey Ferrell is a 70 y.o. woman who is approximately 4 weeks status post robotic hysterectomy and BSO with omentectomy for a large right ovarian mass with final pathology revealing benign serous cystadenofibroma.  Patient is doing very well from a postoperative standpoint and meeting all milestones.  We discussed continued activity restrictions and limitations.  She was encouraged to call us with any concerns moving forward.  Otherwise I am discharging her from my care.  28 minutes of total time was spent for this patient encounter, including preparation, face-to-face counseling with the patient and coordination of care, and documentation of the encounter.  Jeral Pinch, MD  Division of Gynecologic Oncology  Department of Obstetrics and Gynecology  [facility]

## 2021-01-07 NOTE — Patient Instructions (Signed)
You are doing great after surgery. Continue to avoid lifting over 10 pounds for the full six weeks after surgery and do not put anything in your vagina for the full 8 weeks after surgery. Please call the office for any questions or concerns at [phone].

## 2021-03-20 IMAGING — CR DG CHEST 2V
2 series · 2 of 2 positions shown · non-contrast
Comparison: Prior CT from 02/20/2019.

CLINICAL DATA: Initial evaluation for acute chest pain. History of
asthma, COPD.

EXAM:
CHEST - 2 VIEW

[chest pa]
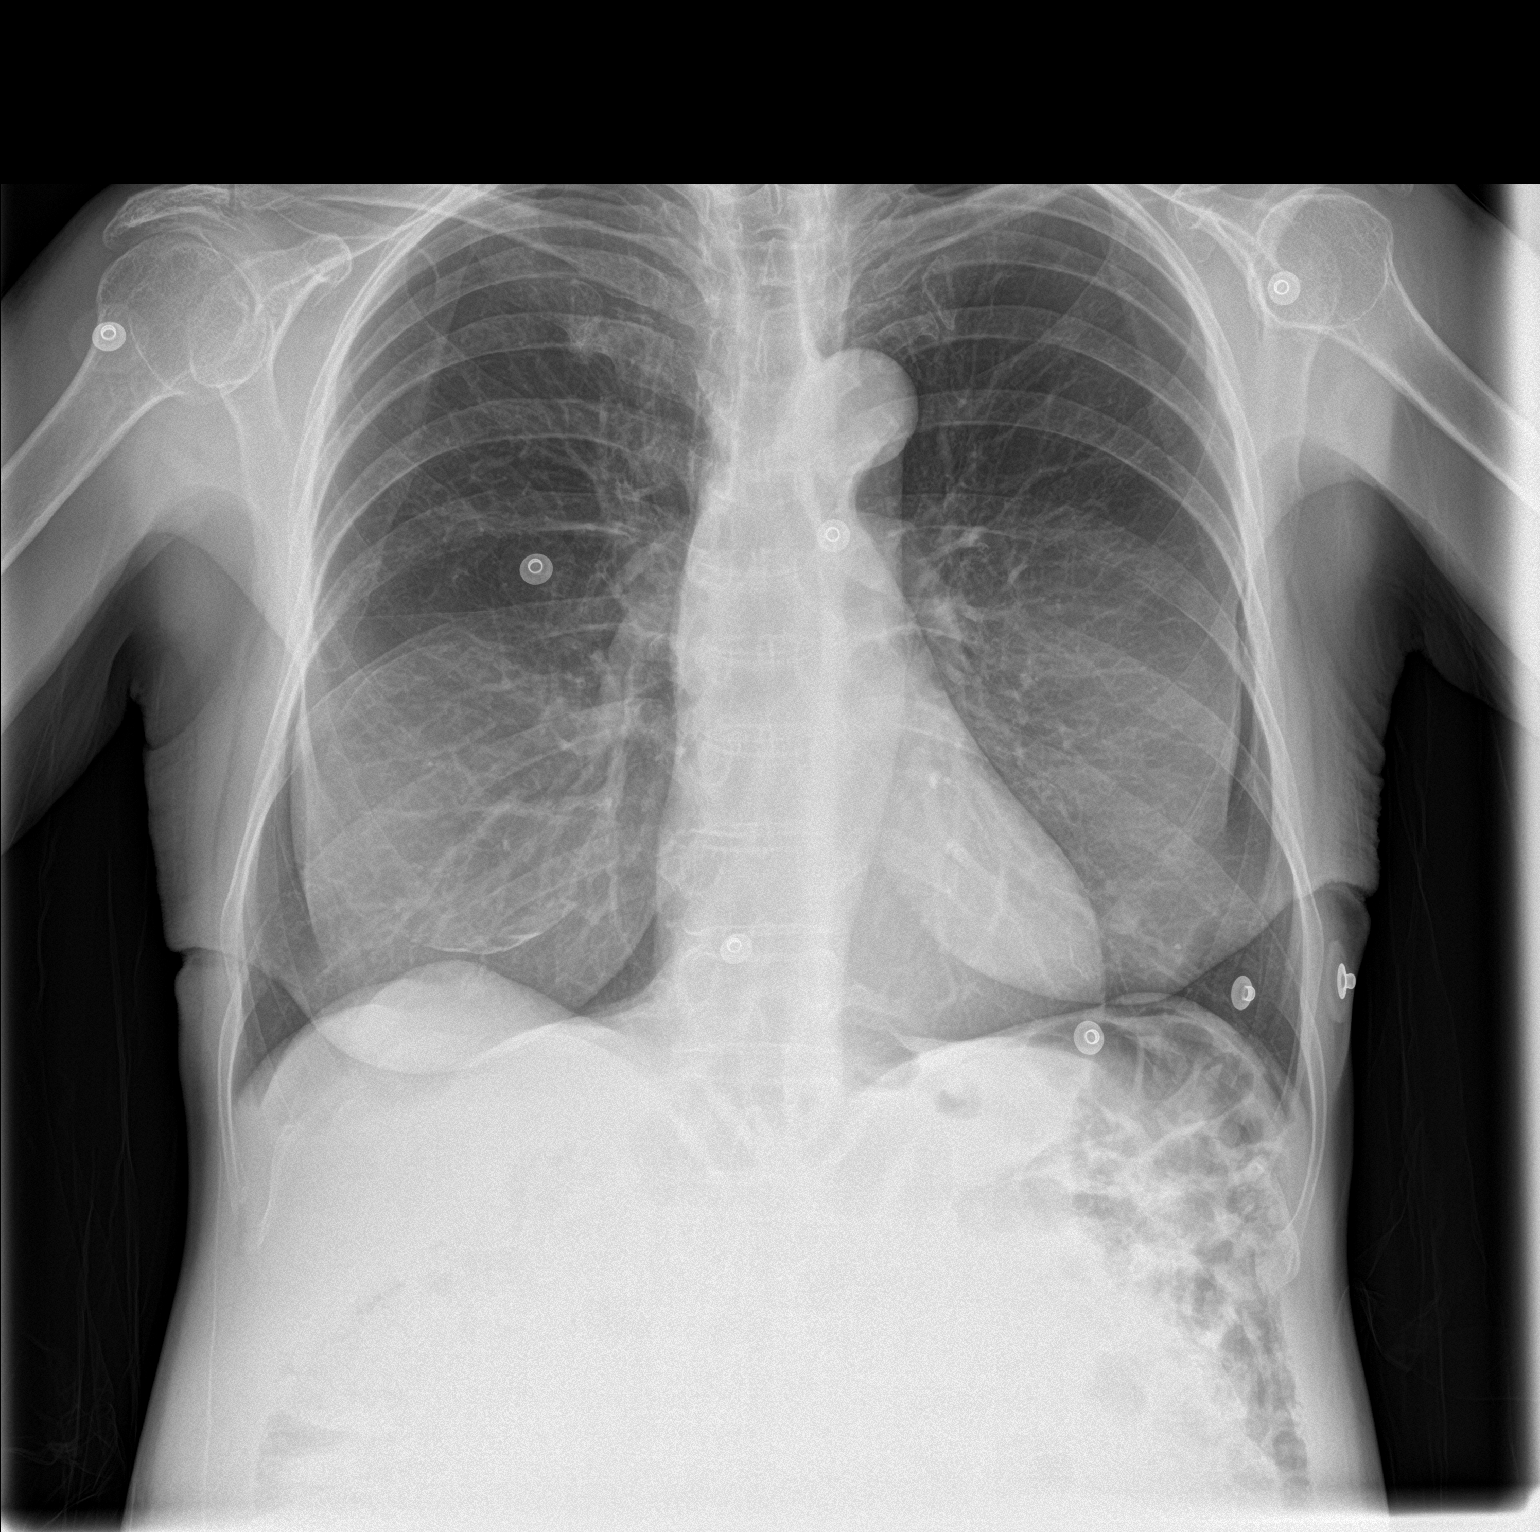

[chest lat]
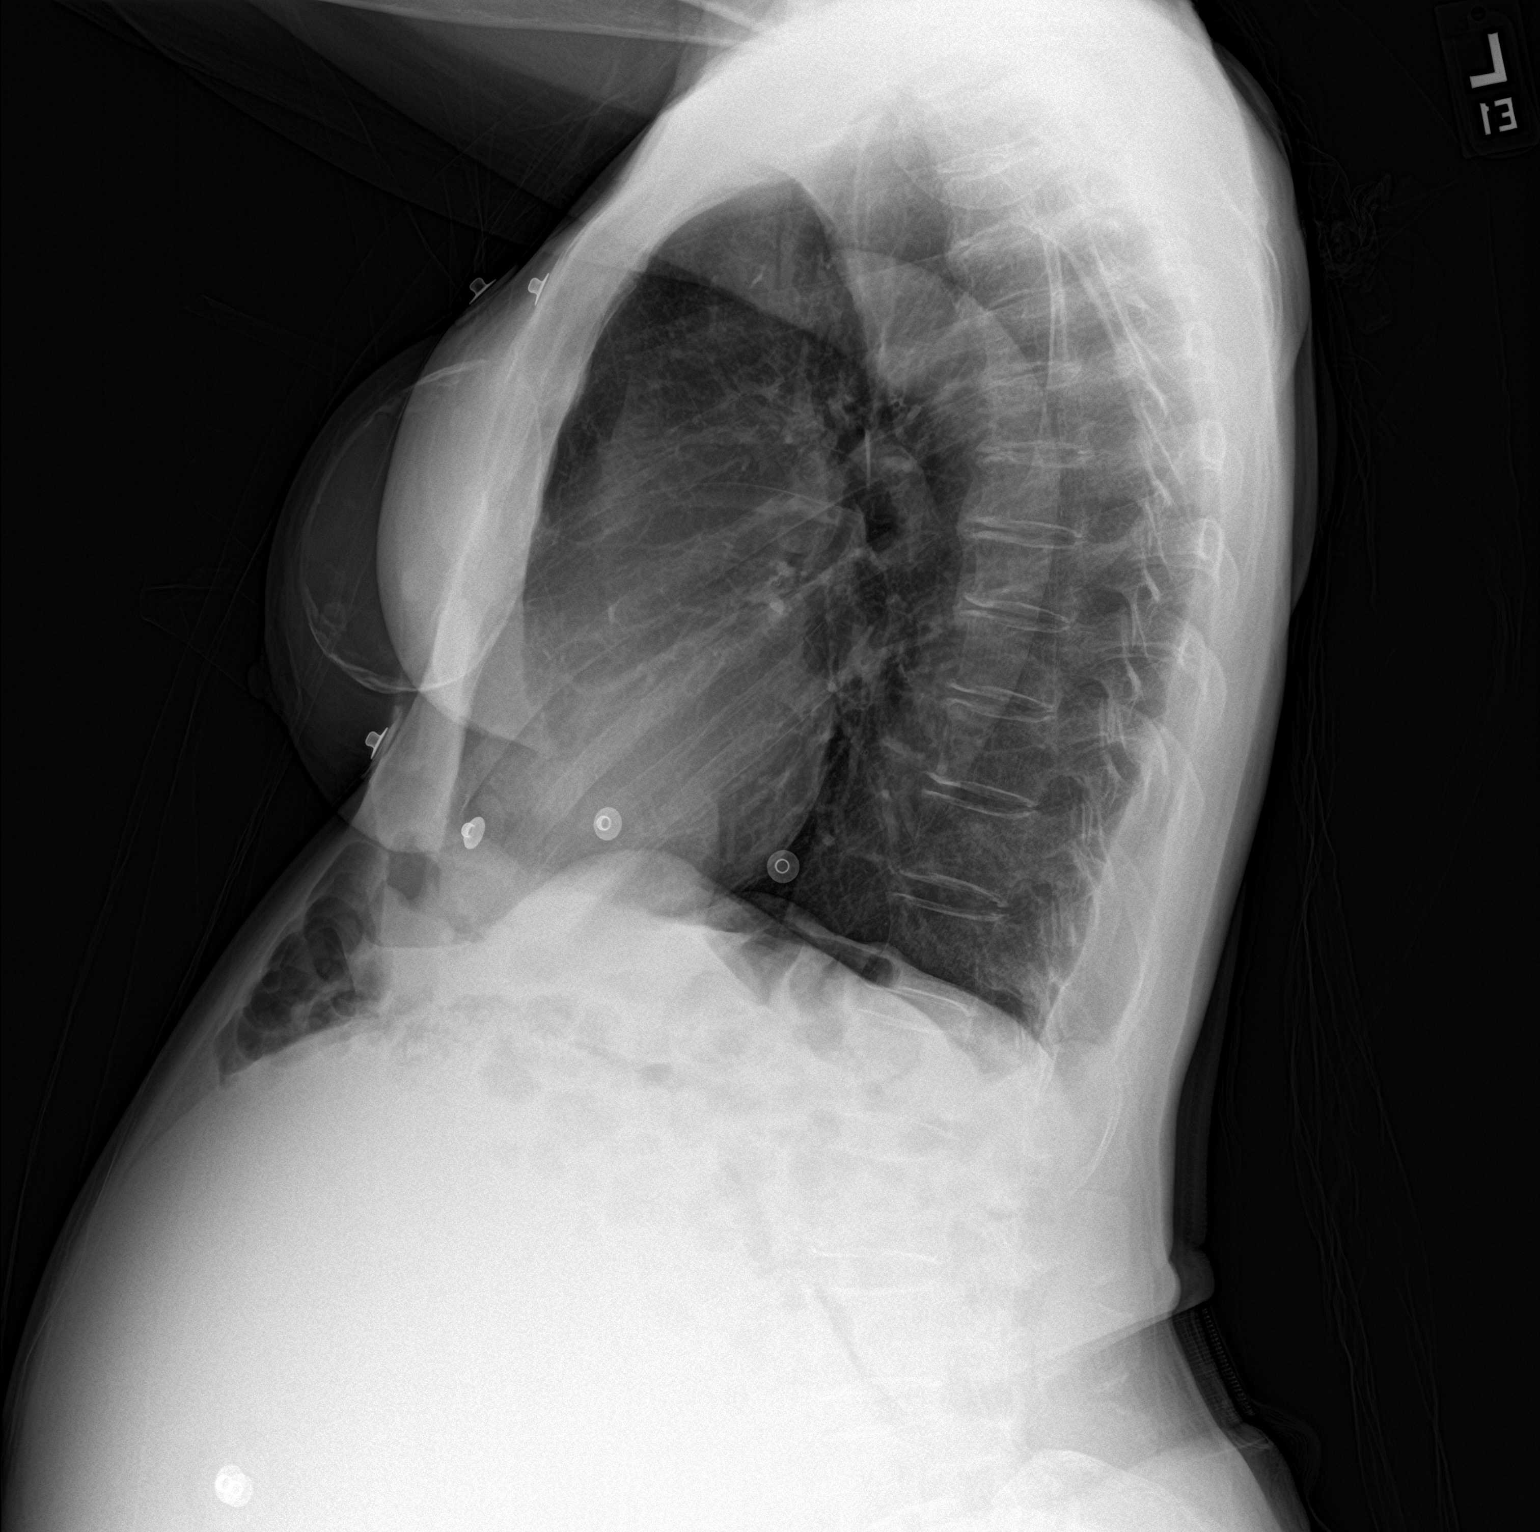

[2 of 2 positions shown; findings below may reference images not displayed]

FINDINGS: Cardiac and mediastinal silhouettes are within normal limits. Aortic
atherosclerosis noted.

Lungs are well inflated. Attenuation of pulmonary markings
compatible with emphysematous changes. No focal infiltrates. No
edema or effusion. No pneumothorax.

Peripherally calcified breast implants noted on the right. No acute
osseous finding.
IMPRESSION: 1. No active cardiopulmonary disease.
2. Aortic Atherosclerosis (VPB77-KI3.3) and Emphysema (VPB77-V6U.O).

## 2021-09-16 ENCOUNTER — Encounter (HOSPITAL_COMMUNITY): Payer: Self-pay

## 2021-09-16 ENCOUNTER — Emergency Department (HOSPITAL_COMMUNITY): Payer: Medicare Other

## 2021-09-16 ENCOUNTER — Emergency Department (HOSPITAL_COMMUNITY)
Admission: EM | Admit: 2021-09-16 | Discharge: 2021-09-16 | Disposition: A | Discharge status: Home / Self Care | Payer: Medicare Other | Attending: Emergency Medicine | Admitting: Emergency Medicine

## 2021-09-16 DIAGNOSIS — Z87891 Personal history of nicotine dependence: Secondary | ICD-10-CM | POA: Diagnosis not present

## 2021-09-16 DIAGNOSIS — U071 COVID-19: Secondary | ICD-10-CM | POA: Diagnosis not present

## 2021-09-16 DIAGNOSIS — I10 Essential (primary) hypertension: Secondary | ICD-10-CM | POA: Insufficient documentation

## 2021-09-16 DIAGNOSIS — J45909 Unspecified asthma, uncomplicated: Secondary | ICD-10-CM | POA: Insufficient documentation

## 2021-09-16 DIAGNOSIS — R109 Unspecified abdominal pain: Secondary | ICD-10-CM | POA: Diagnosis not present

## 2021-09-16 DIAGNOSIS — R0602 Shortness of breath: Secondary | ICD-10-CM | POA: Diagnosis present

## 2021-09-16 DIAGNOSIS — J441 Chronic obstructive pulmonary disease with (acute) exacerbation: Secondary | ICD-10-CM | POA: Diagnosis not present

## 2021-09-16 LAB — COMPREHENSIVE METABOLIC PANEL
ALT: 14 U/L (ref 0–44)
AST: 19 U/L (ref 15–41)
Albumin: 4.1 g/dL (ref 3.5–5.0)
Alkaline Phosphatase: 124 U/L (ref 38–126)
Anion gap: 9 (ref 5–15)
BUN: 9 mg/dL (ref 8–23)
CO2: 27 mmol/L (ref 22–32)
Calcium: 9.2 mg/dL (ref 8.9–10.3)
Chloride: 101 mmol/L (ref 98–111)
Creatinine, Ser: 0.62 mg/dL (ref 0.44–1.00)
GFR, Estimated: >60 mL/min (ref >60)
Glucose, Bld: 116 mg/dL — ABNORMAL HIGH (ref 70–99)
Potassium: 3.7 mmol/L (ref 3.5–5.1)
Sodium: 137 mmol/L (ref 135–145)
Total Bilirubin: 0.5 mg/dL (ref 0.3–1.2)
Total Protein: 7.3 g/dL (ref 6.5–8.1)

## 2021-09-16 LAB — CBC WITH DIFFERENTIAL/PLATELET
Abs Immature Granulocytes: 0.02 10*3/uL (ref 0.00–0.07)
Basophils Absolute: 0.1 10*3/uL (ref 0.0–0.1)
Basophils Relative: 1 %
Eosinophils Absolute: 0.1 10*3/uL (ref 0.0–0.5)
Eosinophils Relative: 2 %
HCT: 43 % (ref 36.0–46.0)
Hemoglobin: 13.6 g/dL (ref 12.0–15.0)
Immature Granulocytes: 0 %
Lymphocytes Relative: 38 %
Lymphs Abs: 2 10*3/uL (ref 0.7–4.0)
MCH: 29.6 pg (ref 26.0–34.0)
MCHC: 31.6 g/dL (ref 30.0–36.0)
MCV: 93.7 fL (ref 80.0–100.0)
Monocytes Absolute: 0.3 10*3/uL (ref 0.1–1.0)
Monocytes Relative: 6 %
Neutro Abs: 2.9 10*3/uL (ref 1.7–7.7)
Neutrophils Relative %: 53 %
Platelets: 393 10*3/uL (ref 150–400)
RBC: 4.59 MIL/uL (ref 3.87–5.11)
RDW: 13.1 % (ref 11.5–15.5)
WBC: 5.4 10*3/uL (ref 4.0–10.5)
nRBC: 0 % (ref 0.0–0.2)

## 2021-09-16 LAB — BRAIN NATRIURETIC PEPTIDE: B Natriuretic Peptide: 49.4 pg/mL (ref 0.0–100.0)

## 2021-09-16 LAB — RESP PANEL BY RT-PCR (FLU A&B, COVID) ARPGX2
Influenza A by PCR: NEGATIVE
Influenza B by PCR: NEGATIVE
SARS Coronavirus 2 by RT PCR: POSITIVE — AB

## 2021-09-16 LAB — TROPONIN I (HIGH SENSITIVITY)
Troponin I (High Sensitivity): 5 ng/L (ref <18)
Troponin I (High Sensitivity): 6 ng/L (ref <18)

## 2021-09-16 LAB — D-DIMER, QUANTITATIVE: D-Dimer, Quant: 0.34 ug/mL-FEU (ref 0.00–0.50)

## 2021-09-16 MED ORDER — NIRMATRELVIR/RITONAVIR (PAXLOVID)TABLET: 3.0000 | ORAL_TABLET | Freq: Two times a day (BID) | ORAL | 0 refills | Status: AC

## 2021-09-16 NOTE — ED Triage Notes (Signed)
Endorses lots of phlegm with and pink tinged. Not wearing O2 per ordered d/t noise of machine.  Pt also having abd. Pain with coughing with hx of abd surgery approx. 73mos ago

## 2021-09-16 NOTE — ED Notes (Signed)
Pt ambulated with assistance total of 76ft. Pt o2 sat started at 99% on room air and dropped to 98% on room air. Pt has no complaints while ambulating. Pt was assisted back into bed. Bed locked low in a safe position call light within reach.

## 2021-09-16 NOTE — ED Provider Notes (Signed)
Emergency Department Provider Note   I have reviewed the triage vital signs and the nursing notes.   HISTORY  Chief Complaint Cough (so) and COPD (Pt having cough and burning in chest and having SOB. Pt on home O2 via [place]. Endorses lots of phlegm with and pink tinged. Not wearing O2 per ordered d/t noise of machine.  Pt also having abd. Pain with coughing with hx of abd surgery approx. 55mos ago)   HPI Tracey Ferrell is a 70 y.o. female presents to the emergency department for evaluation of cough, chest pain, and congestion.  Patient is having some sharp chest pain which is present only with coughing.  Denies any pleuritic pain.  She does have history of COPD and does have home oxygen available but often does not require it. She is not having fever or chills.  She reports some discomfort in her abdomen, also only with coughing, but states this is not significantly changed from her GYN procedure earlier this year.  Denies any drainage from the wounds.  No central chest pressure or tightness.  The sharp pain is just to the left of the sternum without radiation. Notes intermittent leg edema.    Past Medical History:  Diagnosis Date   Acute respiratory failure with hypoxia ([place]) 04/13/2015   Asthma    Back pain    Borderline diabetic    Bronchitis, acute 04/13/2015   COPD (chronic obstructive pulmonary disease) ([facility])    Depression    Fibromyalgia    GERD (gastroesophageal reflux disease)    Hyperlipidemia    Hypertension    LOC (loss of consciousness) ([place]) 04/14/2015   OAB (overactive bladder)    Ovarian mass    Polycythemia 04/13/2015   Shortness of breath dyspnea     Patient Active Problem List   Diagnosis Date Noted   Ovarian cyst 12/18/2020   Pelvic mass in female 12/17/2020   Ovarian cyst, complex 02/21/2019   COPD (chronic obstructive pulmonary disease) ([place]) 02/13/2019   COPD exacerbation ([place]) 04/13/2015   Depression 04/13/2015   Fibromyalgia 04/13/2015   Dyslipidemia  04/13/2015   HTN (hypertension) 04/13/2015    Past Surgical History:  Procedure Laterality Date   ABDOMINAL SURGERY     COSMETIC SURGERY     ROBOTIC ASSISTED BILATERAL SALPINGO OOPHERECTOMY N/A 12/17/2020   Procedure: XI ROBOTIC ASSISTED TOTAL HYSTERECTOMY WITH BILATERAL SALPINGO OOPHORECTOMY AND MINI LAPAROTOMY WITH OMENTECTOMY;  Surgeon: Lafonda Mosses, MD;  Location: [facility];  Service: Gynecology;  Laterality: N/A;    Allergies Sulfa antibiotics  No family history on file.  Social History Social History   Tobacco Use   Smoking status: Former    Packs/day: 0.50    Years: 44.00    Pack years: 22.00    Types: Cigarettes    Quit date: 12/30/2014    Years since quitting: 6.7   Smokeless tobacco: Never  Vaping Use   Vaping Use: Never used  Substance Use Topics   Alcohol use: No    Comment: former   Drug use: No    Review of Systems  Constitutional: No fever/chills Eyes: No visual changes. ENT: No sore throat. Cardiovascular: Positive chest pain w/ coughing.  Respiratory: Mild shortness of breath and cough.  Gastrointestinal: Positive more chronic abdominal pain.  No nausea, no vomiting.  No diarrhea.  No constipation. Genitourinary: Negative for dysuria. Musculoskeletal: Negative for back pain. Skin: Negative for rash. Neurological: Negative for headaches, focal weakness or numbness.  10-point ROS otherwise negative.  ____________________________________________   PHYSICAL EXAM:  VITAL SIGNS: ED Triage Vitals  Enc Vitals Group     BP 09/16/21 1531 (!) 178/66     Pulse Rate 09/16/21 1531 63     Resp 09/16/21 1531 14     Temp 09/16/21 1531 98.6 F (37 C)     Temp Source 09/16/21 1531 Oral     SpO2 09/16/21 1531 100 %     Weight 09/16/21 1550 105 lb (47.6 kg)     Height 09/16/21 1550 4\' 9"  (1.448 m)   Constitutional: Alert and oriented. Well appearing and in no acute distress. Eyes: Conjunctivae are normal.  Head: Atraumatic. Nose: No  congestion/rhinnorhea. Mouth/Throat: Mucous membranes are moist.   Neck: No stridor.   Cardiovascular: Normal rate, regular rhythm. Good peripheral circulation. Grossly normal heart sounds.   Respiratory: Normal respiratory effort.  No retractions. Lungs with faint crackles and occasional end-expiratory wheezing.  Gastrointestinal: Soft and nontender. No distention.  Musculoskeletal: No gross deformities of extremities. Neurologic:  Normal speech and language. No gross focal neurologic deficits are appreciated.  Skin:  Skin is warm, dry and intact. No rash noted.   ____________________________________________   LABS (all labs ordered are listed, but only abnormal results are displayed)  Labs Reviewed  RESP PANEL BY RT-PCR (FLU A&B, COVID) ARPGX2 - Abnormal; Notable for the following components:      Result Value   SARS Coronavirus 2 by RT PCR POSITIVE (*)    All other components within normal limits  COMPREHENSIVE METABOLIC PANEL - Abnormal; Notable for the following components:   Glucose, Bld 116 (*)    All other components within normal limits  BRAIN NATRIURETIC PEPTIDE  CBC WITH DIFFERENTIAL/PLATELET  D-DIMER, QUANTITATIVE  TROPONIN I (HIGH SENSITIVITY)  TROPONIN I (HIGH SENSITIVITY)   ____________________________________________  EKG   EKG Interpretation  Date/Time:  Thursday September 16 2021 16:07:19 EST Ventricular Rate:  60 PR Interval:  166 QRS Duration: 129 QT Interval:  473 QTC Calculation: 473 R Axis:   76 Text Interpretation: Sinus rhythm Nonspecific intraventricular conduction delay Similar to Feb 2022 tracing Confirmed by Nanda Quinton [phone]) on 09/16/2021 4:16:08 PM        ____________________________________________  RADIOLOGY  DG Chest Portable 1 View  Result Date: 09/16/2021 CLINICAL DATA:  Cough and shortness of breath.  COPD. EXAM: PORTABLE CHEST 1 VIEW COMPARISON:  Radiograph 12/10/2020.  Chest CT 02/20/2019 FINDINGS: The heart is normal in  size. Normal mediastinal contours with aortic atherosclerosis. Chronic bronchial thickening without focal airspace disease. No pleural effusion or pneumothorax. No pulmonary edema. The bones are diffusely under mineralized. IMPRESSION: Chronic bronchial thickening without focal airspace disease. Electronically Signed   By: Keith Rake M.D.   On: 09/16/2021 17:15    ____________________________________________   PROCEDURES  Procedure(s) performed:   Procedures  None  ____________________________________________   INITIAL IMPRESSION / ASSESSMENT AND PLAN / ED COURSE  Pertinent labs & imaging results that were available during my care of the patient were reviewed by me and considered in my medical decision making (see chart for details).   Patient presents to the emergency department with cough, congestion, and sharp pain. Pain present with coughing only.   Differential includes all life-threatening causes for chest pain. This includes but is not exclusive to acute coronary syndrome, aortic dissection, pulmonary embolism, cardiac tamponade, community-acquired pneumonia, pericarditis, musculoskeletal chest wall pain, etc.  Patient is well appearing and not requiring supplemental O2. Some COPD component is possible. Will need ACS evaluation but CP  only with cough so much lower suspicion for ACS or PE.   Patient's COVID PCR is come back positive.  She is not hypoxemic or in respiratory distress.  Does not seem to have significant COPD symptoms.  Much of her cough and fatigue symptoms are explained by her COVID.  I do not see evidence of pneumonia on chest x-ray.  She is a good candidate for Paxlovid. We discussed starting this medication and taking as directed for the next 5 days. Discussed strict ED return precautions and quarantine recommendations. Troponin is WNL. BNP and D-dimer are negative. CXR without PNA.   Sion Thane was evaluated in Emergency Department on 09/16/2021 for the  symptoms described in the history of present illness. She was evaluated in the context of the global COVID-19 pandemic, which necessitated consideration that the patient might be at risk for infection with the SARS-CoV-2 virus that causes COVID-19. Institutional protocols and algorithms that pertain to the evaluation of patients at risk for COVID-19 are in a state of rapid change based on information released by regulatory bodies including the CDC and federal and state organizations. These policies and algorithms were followed during the patient's care in the ED.   ____________________________________________  FINAL CLINICAL IMPRESSION(S) / ED DIAGNOSES  Final diagnoses:  COVID-19    NEW OUTPATIENT MEDICATIONS STARTED DURING THIS VISIT:  New Prescriptions   NIRMATRELVIR/RITONAVIR EUA (PAXLOVID) 20 X 150 MG & 10 X 100MG  TABS    Take 3 tablets by mouth 2 (two) times daily for 5 days. Patient GFR is >60. Take nirmatrelvir (150 mg) two tablets twice daily for 5 days and ritonavir (100 mg) one tablet twice daily for 5 days.    Note:  This document was prepared using Dragon voice recognition software and may include unintentional dictation errors.  Nanda Quinton, MD, [facility] Emergency Medicine    Aiesha Leland, [facility], MD 09/16/21 2159

## 2021-09-16 NOTE — Discharge Instructions (Signed)
You were seen in the emergency room today with cough and congestion symptoms.  You have tested positive for COVID.  I am starting you on a medication to help prevent you from getting worse called Paxlovid.  Please start this medication as soon as you are able and take it for the next 5 days as directed.  If you develop chest pain, shortness of breath, confusion, or if you are not able to eat or drink you should return to the emergency department.  These remain in quarantine for at least the next 5 days.

## 2021-10-07 ENCOUNTER — Other Ambulatory Visit: Payer: Self-pay

## 2021-10-07 ENCOUNTER — Emergency Department (HOSPITAL_COMMUNITY)
Admission: EM | Admit: 2021-10-07 | Discharge: 2021-10-08 | Disposition: A | Discharge status: Home / Self Care | Payer: Medicare Other | Attending: Emergency Medicine | Admitting: Emergency Medicine

## 2021-10-07 ENCOUNTER — Encounter (HOSPITAL_COMMUNITY): Payer: Self-pay

## 2021-10-07 ENCOUNTER — Emergency Department (HOSPITAL_COMMUNITY): Payer: Medicare Other

## 2021-10-07 DIAGNOSIS — K5900 Constipation, unspecified: Secondary | ICD-10-CM | POA: Diagnosis not present

## 2021-10-07 DIAGNOSIS — R059 Cough, unspecified: Secondary | ICD-10-CM | POA: Diagnosis present

## 2021-10-07 DIAGNOSIS — R042 Hemoptysis: Secondary | ICD-10-CM | POA: Diagnosis not present

## 2021-10-07 DIAGNOSIS — R1030 Lower abdominal pain, unspecified: Secondary | ICD-10-CM

## 2021-10-07 DIAGNOSIS — Z87891 Personal history of nicotine dependence: Secondary | ICD-10-CM | POA: Insufficient documentation

## 2021-10-07 DIAGNOSIS — U071 COVID-19: Secondary | ICD-10-CM | POA: Diagnosis not present

## 2021-10-07 DIAGNOSIS — I10 Essential (primary) hypertension: Secondary | ICD-10-CM | POA: Diagnosis not present

## 2021-10-07 DIAGNOSIS — Z79899 Other long term (current) drug therapy: Secondary | ICD-10-CM | POA: Insufficient documentation

## 2021-10-07 DIAGNOSIS — J45909 Unspecified asthma, uncomplicated: Secondary | ICD-10-CM | POA: Insufficient documentation

## 2021-10-07 DIAGNOSIS — J441 Chronic obstructive pulmonary disease with (acute) exacerbation: Secondary | ICD-10-CM | POA: Insufficient documentation

## 2021-10-07 LAB — CBC WITH DIFFERENTIAL/PLATELET
Abs Immature Granulocytes: 0.02 10*3/uL (ref 0.00–0.07)
Basophils Absolute: 0.1 10*3/uL (ref 0.0–0.1)
Basophils Relative: 1 %
Eosinophils Absolute: 0.2 10*3/uL (ref 0.0–0.5)
Eosinophils Relative: 2 %
HCT: 42.4 % (ref 36.0–46.0)
Hemoglobin: 13.8 g/dL (ref 12.0–15.0)
Immature Granulocytes: 0 %
Lymphocytes Relative: 39 %
Lymphs Abs: 3.5 10*3/uL (ref 0.7–4.0)
MCH: 30.1 pg (ref 26.0–34.0)
MCHC: 32.5 g/dL (ref 30.0–36.0)
MCV: 92.6 fL (ref 80.0–100.0)
Monocytes Absolute: 0.5 10*3/uL (ref 0.1–1.0)
Monocytes Relative: 5 %
Neutro Abs: 4.8 10*3/uL (ref 1.7–7.7)
Neutrophils Relative %: 53 %
Platelets: 393 10*3/uL (ref 150–400)
RBC: 4.58 MIL/uL (ref 3.87–5.11)
RDW: 12.9 % (ref 11.5–15.5)
WBC: 9.1 10*3/uL (ref 4.0–10.5)
nRBC: 0 % (ref 0.0–0.2)

## 2021-10-07 LAB — COMPREHENSIVE METABOLIC PANEL
ALT: 18 U/L (ref 0–44)
AST: 27 U/L (ref 15–41)
Albumin: 4.4 g/dL (ref 3.5–5.0)
Alkaline Phosphatase: 125 U/L (ref 38–126)
Anion gap: 12 (ref 5–15)
BUN: 17 mg/dL (ref 8–23)
CO2: 23 mmol/L (ref 22–32)
Calcium: 9.8 mg/dL (ref 8.9–10.3)
Chloride: 103 mmol/L (ref 98–111)
Creatinine, Ser: 0.63 mg/dL (ref 0.44–1.00)
GFR, Estimated: >60 mL/min (ref >60)
Glucose, Bld: 115 mg/dL — ABNORMAL HIGH (ref 70–99)
Potassium: 3.9 mmol/L (ref 3.5–5.1)
Sodium: 138 mmol/L (ref 135–145)
Total Bilirubin: 0.5 mg/dL (ref 0.3–1.2)
Total Protein: 7.4 g/dL (ref 6.5–8.1)

## 2021-10-07 LAB — RESP PANEL BY RT-PCR (FLU A&B, COVID) ARPGX2
Influenza A by PCR: NEGATIVE
Influenza B by PCR: NEGATIVE
SARS Coronavirus 2 by RT PCR: POSITIVE — AB

## 2021-10-07 LAB — LIPASE, BLOOD: Lipase: 30 U/L (ref 11–51)

## 2021-10-07 MED ORDER — IOHEXOL 300 MG/ML  SOLN
100.0000 mL | Freq: Once | INTRAMUSCULAR | Status: AC | PRN
  Administered 2021-10-07: 100 mL via INTRAVENOUS

## 2021-10-07 NOTE — ED Triage Notes (Signed)
Pt BIBA d/t right sided lower abdominal pain. Per medic patient has distention on right side and is complaining of constipation. Patient not endorsing any tenderness or no masses visualized. Patient also has not been able to get medications d/t financial and living status being less than favorable and presents with abnormal vitals per medic.   Vitals: 210/128, HR 70, O2: 98%, CBG: 200
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
## 2021-10-08 ENCOUNTER — Emergency Department (HOSPITAL_COMMUNITY): Payer: Medicare Other

## 2021-10-08 DIAGNOSIS — U071 COVID-19: Secondary | ICD-10-CM | POA: Diagnosis not present

## 2021-10-08 MED ORDER — BISACODYL 5 MG PO TBEC
5.0000 mg | DELAYED_RELEASE_TABLET | Freq: Once | ORAL | Status: AC
  Administered 2021-10-08: 5 mg via ORAL
  Filled 2021-10-08: qty 1

## 2021-10-08 NOTE — ED Provider Notes (Signed)
[facility] EMERGENCY DEPARTMENT Provider Note   CSN: [id number] Arrival date & time: 10/07/21  1802     History Chief Complaint  Patient presents with   Abdominal Pain   Cough    Tracey Ferrell is a 70 y.o. female.  The history is provided by the patient.  Abdominal Pain Associated symptoms: cough   Cough She has history of hypertension, hyperlipidemia, COPD and comes in because of lower abdominal pain and constipation.  Pain is crampy, and is temporary relieved by passing flatus.  She denies nausea or vomiting.  Pain is rated at 5/10.  She also has had cough productive of some blood-tinged sputum.  This is actually been going on for several months.  She is currently homeless, but had been living in a place where she was exposed to a lot of cigarette smoke and she actually states she feels better when she is outside.  She has had some subjective fever and chills and sweats.  There has been no nausea or vomiting.   Past Medical History:  Diagnosis Date   Acute respiratory failure with hypoxia ([place]) 04/13/2015   Asthma    Back pain    Borderline diabetic    Bronchitis, acute 04/13/2015   COPD (chronic obstructive pulmonary disease) (HCC)    Depression    Fibromyalgia    GERD (gastroesophageal reflux disease)    Hyperlipidemia    Hypertension    LOC (loss of consciousness) ([place]) 04/14/2015   OAB (overactive bladder)    Ovarian mass    Polycythemia 04/13/2015   Shortness of breath dyspnea     Patient Active Problem List   Diagnosis Date Noted   Ovarian cyst 12/18/2020   Pelvic mass in female 12/17/2020   Ovarian cyst, complex 02/21/2019   COPD (chronic obstructive pulmonary disease) ([place]) 02/13/2019   COPD exacerbation ([place]) 04/13/2015   Depression 04/13/2015   Fibromyalgia 04/13/2015   Dyslipidemia 04/13/2015   HTN (hypertension) 04/13/2015    Past Surgical History:  Procedure Laterality Date   ABDOMINAL SURGERY     COSMETIC SURGERY     ROBOTIC  ASSISTED BILATERAL SALPINGO OOPHERECTOMY N/A 12/17/2020   Procedure: XI ROBOTIC ASSISTED TOTAL HYSTERECTOMY WITH BILATERAL SALPINGO OOPHORECTOMY AND MINI LAPAROTOMY WITH OMENTECTOMY;  Surgeon: Lafonda Mosses, MD;  Location: [facility];  Service: Gynecology;  Laterality: N/A;     OB History   No obstetric history on file.     No family history on file.  Social History   Tobacco Use   Smoking status: Former    Packs/day: 0.50    Years: 44.00    Pack years: 22.00    Types: Cigarettes    Quit date: 12/30/2014    Years since quitting: 6.7   Smokeless tobacco: Never  Vaping Use   Vaping Use: Never used  Substance Use Topics   Alcohol use: No    Comment: former   Drug use: No    Home Medications Prior to Admission medications   Medication Sig Start Date End Date Taking? Authorizing Provider  Cholecalciferol (VITAMIN D3) 50 MCG (2000 UT) TABS Take 2,000 mg by mouth daily.    [provider]  docusate sodium (COLACE) 100 MG capsule Take 1 capsule (100 mg total) by mouth every 12 (twelve) hours. Patient not taking: No sig reported 10/27/20   Carlisle Cater, PA-C  ibuprofen (ADVIL) 600 MG tablet Take 1 tablet (600 mg total) by mouth every 6 (six) hours as needed for moderate pain. For  AFTER surgery only Patient not taking: Reported on 01/07/2021 12/18/20   Joylene John D, NP  ibuprofen (ADVIL) 600 MG tablet TAKE 1 TABLET BY MOUTH EVERY 6 HOURS AS NEEDED FOR MODERATE PAIN. FOR AFTER SURGERY ONLY 12/18/20 12/18/21  Joylene John D, NP  omega-3 acid ethyl esters (LOVAZA) 1 g capsule Take 1,200 mg by mouth daily. Patient not taking: Reported on 01/07/2021    [provider]  senna-docusate (SENOKOT-S) 8.6-50 MG tablet Take 2 tablets by mouth at bedtime. For AFTER surgery, do not take if having diarrhea Patient not taking: Reported on 01/07/2021 12/18/20   Joylene John D, NP  sennosides-docusate sodium (SENOKOT-S) 8.6-50 MG tablet TAKE 2 TABLETS BY MOUTH AT BEDTIME. FOR  AFTER SURGERY, DO NOT TAKE IF HAVING DIARRHEA 12/18/20 12/18/21  Joylene John D, NP  sucralfate (CARAFATE) 1 g tablet Take 1 tablet (1 g total) by mouth 4 (four) times daily as needed. Patient not taking: Reported on 01/07/2021 12/11/20   Maudie Flakes, MD  traMADol (ULTRAM) 50 MG tablet Take 1 tablet (50 mg total) by mouth every 6 (six) hours as needed for severe pain. For AFTER surgery only, do not take and drive Patient not taking: Reported on 01/07/2021 12/18/20   Joylene John D, NP  albuterol (PROVENTIL HFA;VENTOLIN HFA) 108 (90 BASE) MCG/ACT inhaler Inhale 2 puffs into the lungs every 4 (four) hours as needed for wheezing or shortness of breath. Patient not taking: Reported on 10/07/2020 04/15/15 10/27/20  Rai, Vernelle Emerald, MD  amLODipine (NORVASC) 5 MG tablet Take 1 tablet (5 mg total) by mouth daily. Patient not taking: No sig reported 04/15/15 10/27/20  Rai, Vernelle Emerald, MD  atorvastatin (LIPITOR) 20 MG tablet Take 20 mg by mouth daily. Patient not taking: Reported on 10/07/2020 02/03/19 10/27/20  [provider]  gabapentin (NEURONTIN) 300 MG capsule Take 300 mg by mouth at bedtime as needed for pain. Patient not taking: Reported on 10/07/2020 10/22/18 10/27/20  [provider]  omeprazole (PRILOSEC) 20 MG capsule Take 20 mg by mouth daily. Patient not taking: Reported on 10/07/2020  10/27/20  [provider]    Allergies    Sulfa antibiotics  Review of Systems   Review of Systems  Respiratory:  Positive for cough.   Gastrointestinal:  Positive for abdominal pain.  All other systems reviewed and are negative.  Physical Exam Updated Vital Signs BP (!) 165/64 (BP Location: Right Arm)   Pulse 60   Temp 98.4 F (36.9 C) (Oral)   Resp 12   SpO2 96%   Physical Exam Vitals and nursing note reviewed.  70 year old female, resting comfortably and in no acute distress. Vital signs are significant for elevated blood pressure. Oxygen saturation is 96%, which is  normal. Head is normocephalic and atraumatic. PERRLA, EOMI. Oropharynx is clear. Neck is nontender and supple without adenopathy or JVD. Back is nontender and there is no CVA tenderness. Lungs are clear without rales, wheezes, or rhonchi. Chest is nontender. Heart has regular rate and rhythm without murmur. Abdomen is soft, flat, with mild tenderness across the lower abdomen.  There is no rebound or guarding.  There are no masses or hepatosplenomegaly and peristalsis is normoactive. Extremities have no cyanosis or edema, full range of motion is present. Skin is warm and dry without rash. Neurologic: Mental status is normal, cranial nerves are intact, there are no motor or sensory deficits.  ED Results / Procedures / Treatments   Labs (all labs ordered are listed, but  only abnormal results are displayed) Labs Reviewed  RESP PANEL BY RT-PCR (FLU A&B, COVID) ARPGX2 - Abnormal; Notable for the following components:      Result Value   SARS Coronavirus 2 by RT PCR POSITIVE (*)    All other components within normal limits  COMPREHENSIVE METABOLIC PANEL - Abnormal; Notable for the following components:   Glucose, Bld 115 (*)    All other components within normal limits  CBC WITH DIFFERENTIAL/PLATELET  LIPASE, BLOOD   Radiology DG Chest 2 View  Result Date: 10/08/2021 CLINICAL DATA:  Hemoptysis EXAM: CHEST - 2 VIEW COMPARISON:  09/16/2021 FINDINGS: Cardiac shadow is within normal limits. Aortic calcifications are noted. The lungs are well aerated bilaterally. No focal infiltrate or effusion is seen. No bony abnormality is noted. IMPRESSION: No acute abnormality seen. Electronically Signed   By: Inez Catalina M.D.   On: 10/08/2021 01:43   CT ABDOMEN PELVIS W CONTRAST  Result Date: 10/07/2021 CLINICAL DATA:  Acute abdominal pain on the right EXAM: CT ABDOMEN AND PELVIS WITH CONTRAST TECHNIQUE: Multidetector CT imaging of the abdomen and pelvis was performed using the standard protocol following  bolus administration of intravenous contrast. CONTRAST:  177mL OMNIPAQUE IOHEXOL 300 MG/ML  SOLN COMPARISON:  12/11/2020 FINDINGS: Lower chest: No acute abnormality. Hepatobiliary: Fatty infiltration of the liver is noted. Gallbladder is well visualized and within normal limits. Pancreas: Unremarkable. No pancreatic ductal dilatation or surrounding inflammatory changes. Spleen: Normal in size without focal abnormality. Adrenals/Urinary Tract: Adrenal glands are within normal limits. Kidneys show a normal enhancement pattern. 1-2 mm nonobstructing renal stone is noted in the upper pole of the left kidney similar to that seen on the prior exam. No obstructive changes are seen. Delayed images demonstrate normal excretion of contrast material. The bladder is within normal limits. Stomach/Bowel: Appendix is air-filled and within normal limits. No obstructive or inflammatory changes of the colon are seen. Small bowel and stomach appear within normal limits. Vascular/Lymphatic: Aortic atherosclerosis. No enlarged abdominal or pelvic lymph nodes. Reproductive: Status post hysterectomy. No adnexal masses. Other: No abdominal wall hernia or abnormality. No abdominopelvic ascites. Musculoskeletal: No acute or significant osseous findings. IMPRESSION: Fatty infiltration of the liver. Tiny nonobstructing left renal stone. No other focal abnormality is noted. Electronically Signed   By: Inez Catalina M.D.   On: 10/07/2021 21:48    Procedures Procedures   Medications Ordered in ED Medications  bisacodyl (DULCOLAX) EC tablet 5 mg (has no administration in time range)  iohexol (OMNIPAQUE) 300 MG/ML solution 100 mL (100 mLs Intravenous Contrast Given 10/07/21 2128)    ED Course  I have reviewed the triage vital signs and the nursing notes.  Pertinent labs & imaging results that were available during my care of the patient were reviewed by me and considered in my medical decision making (see chart for details).    MDM  Rules/Calculators/A&P                         Mild constipation and with associated lower abdominal cramping.  Labs and CT scan were ordered from triage and are unremarkable.  She is noted to be COVID-positive, and had been COVID-positive on 11/17.  No indication current COVID-positive status is a new infection.  With her hemoptysis, will need to get chest x-ray and this is ordered.  Old records reviewed showing surgery for hysterectomy secondary to complex ovarian cysts, surgery done February 2022.  Chest x-ray shows no acute process.  She  will need to be evaluated by pulmonology and is given ambulatory referral to pulmonology.  She is given a dose of bisacodyl for constipation, advised to use polyethylene glycol as needed.  Given resource guides for homeless shelters and financial assistance for healthcare.  Return precautions discussed.  Final Clinical Impression(s) / ED Diagnoses Final diagnoses:  Constipation, unspecified constipation type  Lower abdominal pain  Hemoptysis    Rx / DC Orders ED Discharge Orders          Ordered    Ambulatory referral to Pulmonology        10/08/21 3794             Delora Fuel, MD 32/76/14 0207

## 2021-10-08 NOTE — Discharge Instructions (Addendum)
Take polyethylene glycol (MiraLAX) as needed for constipation.  Return to the emergency department if you are having any problems.

## 2021-10-08 NOTE — ED Notes (Signed)
Patient transported to X-ray 

## 2021-12-25 IMAGING — DX DG CHEST 1V PORT
1 series · 1 of 1 positions shown · non-contrast
Comparison: Radiograph 12/10/2020.  Chest CT 02/20/2019

CLINICAL DATA: Cough and shortness of breath.  COPD.

EXAM:
PORTABLE CHEST 1 VIEW

[chest ap]
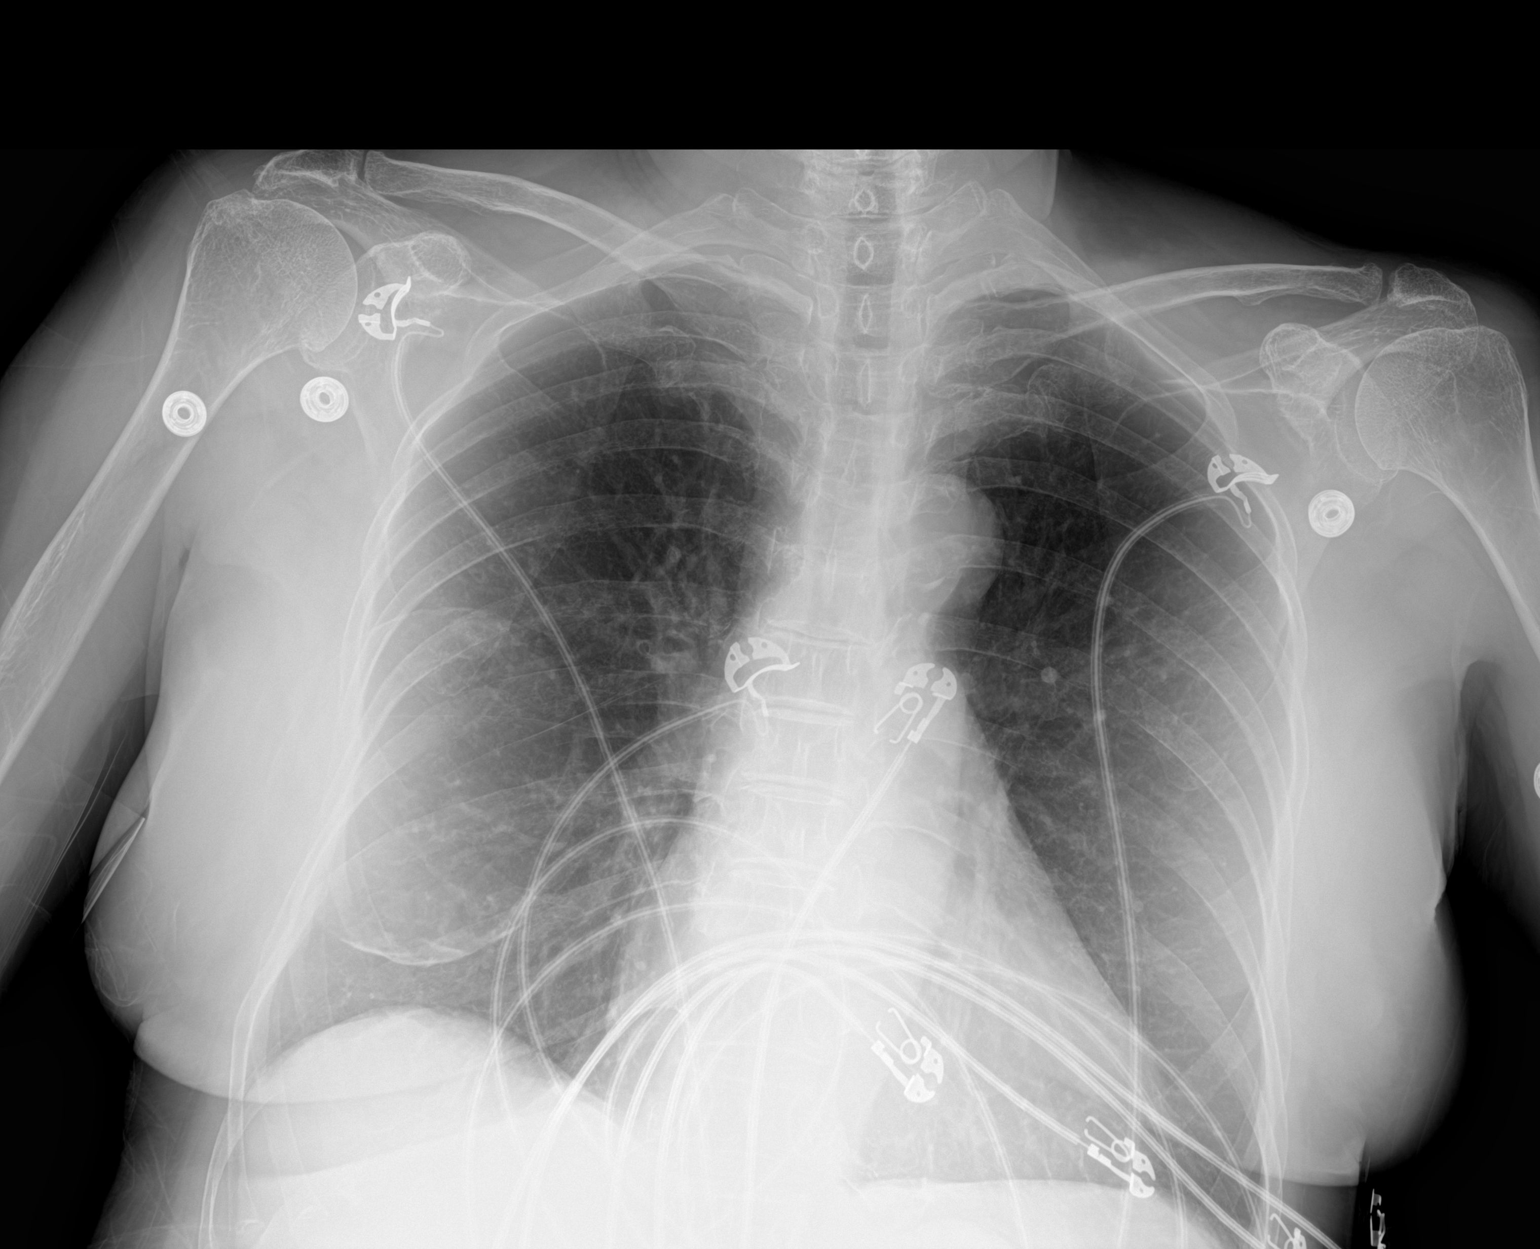

[1 of 1 positions shown; findings below may reference images not displayed]

FINDINGS: The heart is normal in size. Normal mediastinal contours with aortic
atherosclerosis. Chronic bronchial thickening without focal airspace
disease. No pleural effusion or pneumothorax. No pulmonary edema.
The bones are diffusely under mineralized.
IMPRESSION: Chronic bronchial thickening without focal airspace disease.

## 2022-03-10 ENCOUNTER — Emergency Department (HOSPITAL_COMMUNITY)
Admission: EM | Admit: 2022-03-10 | Discharge: 2022-03-10 | Disposition: A | Discharge status: Home / Self Care | Payer: Medicare Other | Attending: Emergency Medicine | Admitting: Emergency Medicine

## 2022-03-10 ENCOUNTER — Encounter (HOSPITAL_COMMUNITY): Payer: Self-pay | Admitting: Emergency Medicine

## 2022-03-10 DIAGNOSIS — I10 Essential (primary) hypertension: Secondary | ICD-10-CM | POA: Diagnosis not present

## 2022-03-10 DIAGNOSIS — R45851 Suicidal ideations: Secondary | ICD-10-CM | POA: Diagnosis not present

## 2022-03-10 DIAGNOSIS — J449 Chronic obstructive pulmonary disease, unspecified: Secondary | ICD-10-CM | POA: Insufficient documentation

## 2022-03-10 DIAGNOSIS — F439 Reaction to severe stress, unspecified: Secondary | ICD-10-CM

## 2022-03-10 DIAGNOSIS — Z79899 Other long term (current) drug therapy: Secondary | ICD-10-CM | POA: Insufficient documentation

## 2022-03-10 DIAGNOSIS — Z733 Stress, not elsewhere classified: Secondary | ICD-10-CM | POA: Insufficient documentation

## 2022-03-10 LAB — CBC
HCT: 43.2 % (ref 36.0–46.0)
Hemoglobin: 14 g/dL (ref 12.0–15.0)
MCH: 29.9 pg (ref 26.0–34.0)
MCHC: 32.4 g/dL (ref 30.0–36.0)
MCV: 92.3 fL (ref 80.0–100.0)
Platelets: 360 10*3/uL (ref 150–400)
RBC: 4.68 MIL/uL (ref 3.87–5.11)
RDW: 13 % (ref 11.5–15.5)
WBC: 7.9 10*3/uL (ref 4.0–10.5)
nRBC: 0 % (ref 0.0–0.2)

## 2022-03-10 LAB — ACETAMINOPHEN LEVEL: Acetaminophen (Tylenol), Serum: <10 ug/mL — ABNORMAL LOW (ref 10–30)

## 2022-03-10 LAB — COMPREHENSIVE METABOLIC PANEL
ALT: 19 U/L (ref 0–44)
AST: 22 U/L (ref 15–41)
Albumin: 4.5 g/dL (ref 3.5–5.0)
Alkaline Phosphatase: 130 U/L — ABNORMAL HIGH (ref 38–126)
Anion gap: 9 (ref 5–15)
BUN: 12 mg/dL (ref 8–23)
CO2: 26 mmol/L (ref 22–32)
Calcium: 9.8 mg/dL (ref 8.9–10.3)
Chloride: 106 mmol/L (ref 98–111)
Creatinine, Ser: 0.61 mg/dL (ref 0.44–1.00)
GFR, Estimated: >60 mL/min (ref >60)
Glucose, Bld: 122 mg/dL — ABNORMAL HIGH (ref 70–99)
Potassium: 3.7 mmol/L (ref 3.5–5.1)
Sodium: 141 mmol/L (ref 135–145)
Total Bilirubin: 0.7 mg/dL (ref 0.3–1.2)
Total Protein: 7.7 g/dL (ref 6.5–8.1)

## 2022-03-10 LAB — SALICYLATE LEVEL: Salicylate Lvl: <7 mg/dL — ABNORMAL LOW (ref 7.0–30.0)

## 2022-03-10 LAB — ETHANOL: Alcohol, Ethyl (B): <10 mg/dL (ref <10)

## 2022-03-10 NOTE — ED Notes (Signed)
Pt is change out belonging (2 pt bag and a backpack) they are all in lock in locker 27. ? ? ?Pt has her bible per RN approval.  ?

## 2022-03-10 NOTE — ED Provider Notes (Signed)
?[facility] DEPT ?Provider Note ? ? ?CSN: [id number] ?Arrival date & time: 03/10/22  1451 ? ?  ? ?History ? ?Chief Complaint  ?Patient presents with  ? Suicidal  ? ? ?Tracey Ferrell is a 71 y.o. female with a past medical history significant for depression, COPD, hypertension, remote history of toxic ingestion greater than 5 years ago who presents in police custody for report of suicidal thoughts with no plan.  After extensive discussion with the patient she reports that she was with decreased sleep, increased stress secondary to neighbor that keeps her awake at night.  She reports that she went up to what looked like a friendly policeman was discussing the stress that she has been having and asked "do you ever feel so stressed that you feel almost suicidal".  At this time multiple police officers grabbed the patient and committed her to police custody saying that she needed to be evaluated for suicidal ideation.  At this time she shows no signs of acute distress, no signs of toxic ingestion, and vehemently denies suicidal ideation, homicidal ideation, audiovisual hallucinations at this time.  She denies any access to gun or other means to kill herself.  She reports absolutely no intent to hurt herself at this time.  She does not wish to stay for psychiatric evaluation if she does not have to. ? ?HPI ? ?  ? ?Home Medications ?Prior to Admission medications   ?Medication Sig Start Date End Date Taking? Authorizing Provider  ?Cholecalciferol (VITAMIN D3) 50 MCG (2000 UT) TABS Take 2,000 mg by mouth daily.    [provider]  ?albuterol (PROVENTIL HFA;VENTOLIN HFA) 108 (90 BASE) MCG/ACT inhaler Inhale 2 puffs into the lungs every 4 (four) hours as needed for wheezing or shortness of breath. ?Patient not taking: Reported on 10/07/2020 04/15/15 10/27/20  Mendel Corning, MD  ?amLODipine (NORVASC) 5 MG tablet Take 1 tablet (5 mg total) by mouth daily. ?Patient not taking: No sig  reported 04/15/15 10/27/20  Rai, Vernelle Emerald, MD  ?atorvastatin (LIPITOR) 20 MG tablet Take 20 mg by mouth daily. ?Patient not taking: Reported on 10/07/2020 02/03/19 10/27/20  [provider]  ?gabapentin (NEURONTIN) 300 MG capsule Take 300 mg by mouth at bedtime as needed for pain. ?Patient not taking: Reported on 10/07/2020 10/22/18 10/27/20  [provider]  ?omeprazole (PRILOSEC) 20 MG capsule Take 20 mg by mouth daily. ?Patient not taking: Reported on 10/07/2020  10/27/20  [provider]  ?   ? ?Allergies    ?Sulfa antibiotics   ? ?Review of Systems   ?Review of Systems  ?Psychiatric/Behavioral:  Negative for behavioral problems and suicidal ideas.   ?All other systems reviewed and are negative. ? ?Physical Exam ?Updated Vital Signs ?BP (!) 196/88 (BP Location: Right Arm)   Pulse 87   Temp 98.1 ?F (36.7 ?C) (Oral)   Resp 18   SpO2 97%  ?Physical Exam ?Vitals and nursing note reviewed.  ?Constitutional:   ?   General: She is not in acute distress. ?   Appearance: Normal appearance.  ?HENT:  ?   Head: Normocephalic and atraumatic.  ?Eyes:  ?   General:     ?   Right eye: No discharge.     ?   Left eye: No discharge.  ?Cardiovascular:  ?   Rate and Rhythm: Normal rate and regular rhythm.  ?Pulmonary:  ?   Effort: Pulmonary effort is normal. No respiratory distress.  ?Musculoskeletal:     ?  General: No deformity.  ?Skin: ?   General: Skin is warm and dry.  ?Neurological:  ?   Mental Status: She is alert and oriented to person, place, and time.  ?Psychiatric:     ?   Mood and Affect: Mood normal.     ?   Behavior: Behavior normal.  ?   Comments: Patient with normal thought content, responds all questions appropriately.  She seems somewhat stressed after being forcibly brought here under police custody.  She vehemently denies suicidal ideation, homicidal ideation, AVH.  She denies alcohol or drug use.  She shows no signs of psychosis, does not seem to be responding to internal stimuli.   ? ? ?ED Results / Procedures / Treatments   ?Labs ?(all labs ordered are listed, but only abnormal results are displayed) ?Labs Reviewed  ?CBC  ?COMPREHENSIVE METABOLIC PANEL  ?ETHANOL  ?SALICYLATE LEVEL  ?ACETAMINOPHEN LEVEL  ? ? ?EKG ?None ? ?Radiology ?No results found. ? ?Procedures ?Procedures  ? ? ?Medications Ordered in ED ?Medications - No data to display ? ?ED Course/ Medical Decision Making/ A&P ?  ?                        ?Medical Decision Making ?Amount and/or Complexity of Data Reviewed ?Labs: ordered. ? ? ?I discussed this case with my attending physician who cosigned this note including patient's presenting symptoms, physical exam, and planned diagnostics and interventions. Attending physician stated agreement with plan or made changes to plan which were implemented.  ? ?Attending physician assessed patient at bedside. ? ?This is a well-appearing 71 year old female with remote history of intentional ingestion x 1, COPD, depression who presents with concern for suicidal ideation by [employer]  Please see detailed history and physical exam findings above.  On my assessment I have no clinical suspicion that patient is actively suicidal, homicidal.  She does not wish for emergent psychiatric evaluation in the hospital today.  She reports that she can follow-up outpatient as needed if she has worsening stress.  I think that this is reasonable.  Do not think that she needs labs for medical clearance as she denies toxic ingestion, shows no signs of toxic ingestion.  Some blood work was obtained prior to seeing this patient, her CBC is unremarkable.  She is somewhat hypertensive on arrival with systolic of 330.  She denies chest pain, shortness of breath, vision changes.  No concern for hypertensive emergency at this time.  Likely elevated secondary to stress after being forcibly brought to the emergency department against her well.  Think that she is stable for discharge with outpatient  psych follow-up.  Patient understands and agrees to this plan, discharged in stable condition with psychiatric resources. ?Final Clinical Impression(s) / ED Diagnoses ?Final diagnoses:  ?Stress at home  ? ? ?Rx / DC Orders ?ED Discharge Orders   ? ? None  ? ?  ? ? ?  ?Anselmo Pickler, PA-C ?03/10/22 1621 ? ?  ?Carmin Muskrat, MD ?03/10/22 2239 ? ?

## 2022-03-10 NOTE — ED Triage Notes (Signed)
Patient here via police reporting suicidal thoughts with no plan. Hx of depression and COPD.  ?

## 2022-03-10 NOTE — ED Notes (Signed)
Pt got a lunch tray.  ?

## 2022-03-10 NOTE — Discharge Instructions (Addendum)
,,

## 2022-07-15 ENCOUNTER — Telehealth: Payer: Self-pay | Admitting: *Deleted

## 2022-07-15 NOTE — Patient Outreach (Signed)
  Care Coordination   07/15/2022 Name: Tracey Ferrell MRN: [id number] DOB: [birth year]   Care Coordination Outreach Attempts:  An unsuccessful telephone outreach was attempted today to offer the patient information about available care coordination services as a benefit of their health plan.   Follow Up Plan:  Additional outreach attempts will be made to offer the patient care coordination information and services.  Left a voicemail and sent Text with NP information for a call back.  Encounter Outcome:  No Answer  Care Coordination Interventions Activated:  No   Care Coordination Interventions:  No, not indicated    SIG Tereasa Yilmaz C. Myrtie Neither, MSN, [facility] Gerontological Nurse Practitioner [facility] Care Management [phone]

## 2022-07-15 NOTE — Patient Outreach (Signed)
  Care Coordination   07/15/2022 Name: Tracey Ferrell MRN: [id number] DOB: [birth year]   Care Coordination Outreach Attempts:  An unsuccessful telephone outreach was attempted today to offer the patient information about available care coordination services as a benefit of their health plan.  NP called listed contact Pearson Forster as pt needs a [place] interpreter to see if he is able to provide the service or if NP needs to arrange an interpreter. No answer, left a message and requested a return call.  Follow Up Plan:  Additional outreach attempts will be made to offer the patient care coordination information and services.   Encounter Outcome:  No Answer  Care Coordination Interventions Activated:  No   Care Coordination Interventions:  No, not indicated    Muriah Harsha C. Myrtie Neither, MSN, [facility] Gerontological Nurse Practitioner [facility] Care Management [phone]

## 2022-08-11 ENCOUNTER — Telehealth: Payer: Self-pay | Admitting: *Deleted

## 2022-08-11 NOTE — Patient Outreach (Signed)
  Care Coordination   08/11/2022 Name: Tracey Ferrell MRN: [id number] DOB: 04-29-1951   Care Coordination Outreach Attempts:  A second unsuccessful outreach was attempted today to offer the patient with information about available care coordination services as a benefit of their health plan.     Follow Up Plan:  Additional outreach attempts will be made to offer the patient care coordination information and services.   Encounter Outcome:  No Answer  Care Coordination Interventions Activated:  No   Care Coordination Interventions:  No, not indicated    SIG Destin Vinsant C. Myrtie Neither, MSN, [facility] Gerontological Nurse Practitioner [facility] Care Management [phone]

## 2022-08-17 ENCOUNTER — Telehealth: Payer: Self-pay | Admitting: *Deleted

## 2022-08-17 NOTE — Patient Outreach (Signed)
  Care Coordination   08/17/2022 Name: Tracey Ferrell MRN: [id number] DOB: 09/11/1951   Care Coordination Outreach Attempts:  [employer] CONTACTED TO ASSIST IN 3RD OUTREACH WHICH WAS UNSUCCESSFUL.  Follow Up Plan:  No further outreach attempts will be made at this time. We have been unable to contact the patient to offer or enroll patient in care coordination services  Encounter Outcome:  No Answer  Care Coordination Interventions Activated:  No   Care Coordination Interventions:  No, not indicated    SIG Travis Purk C. Myrtie Neither, MSN, [facility] Gerontological Nurse Practitioner [facility] Care Management [phone]
